# Patient Record
Sex: Female | Born: 1955
Health system: Southern US, Community
[De-identification: ages and names within clinical notes are randomized; demographics above are authoritative.]

## PROBLEM LIST (undated history)

## (undated) DIAGNOSIS — C801 Malignant (primary) neoplasm, unspecified: Secondary | ICD-10-CM

## (undated) DIAGNOSIS — E079 Disorder of thyroid, unspecified: Secondary | ICD-10-CM

## (undated) DIAGNOSIS — G4733 Obstructive sleep apnea (adult) (pediatric): Secondary | ICD-10-CM

## (undated) DIAGNOSIS — E669 Obesity, unspecified: Secondary | ICD-10-CM

## (undated) HISTORY — PX: BACK SURGERY: SHX140

## (undated) HISTORY — DX: Obesity, unspecified: E66.9

## (undated) HISTORY — PX: TOTAL THYROIDECTOMY: SHX2547

## (undated) HISTORY — DX: Obstructive sleep apnea (adult) (pediatric): G47.33

## (undated) HISTORY — DX: Malignant (primary) neoplasm, unspecified: C80.1

## (undated) HISTORY — DX: Disorder of thyroid, unspecified: E07.9

---

## 2004-01-21 ENCOUNTER — Other Ambulatory Visit: Admission: RE | Admit: 2004-01-21 | Discharge: 2004-01-21 | Payer: Self-pay | Admitting: Family Medicine

## 2004-08-27 ENCOUNTER — Inpatient Hospital Stay (HOSPITAL_COMMUNITY): Admission: RE | Admit: 2004-08-27 | Discharge: 2004-08-28 | Payer: Self-pay | Admitting: Neurosurgery

## 2004-10-12 ENCOUNTER — Encounter: Admission: RE | Admit: 2004-10-12 | Discharge: 2004-10-12 | Payer: Self-pay | Admitting: Neurosurgery

## 2004-11-11 ENCOUNTER — Ambulatory Visit (HOSPITAL_COMMUNITY): Admission: RE | Admit: 2004-11-11 | Discharge: 2004-11-11 | Payer: Self-pay | Admitting: Orthopedic Surgery

## 2004-11-11 ENCOUNTER — Encounter (INDEPENDENT_AMBULATORY_CARE_PROVIDER_SITE_OTHER): Payer: Self-pay | Admitting: Specialist

## 2004-11-11 ENCOUNTER — Ambulatory Visit (HOSPITAL_BASED_OUTPATIENT_CLINIC_OR_DEPARTMENT_OTHER): Admission: RE | Admit: 2004-11-11 | Discharge: 2004-11-11 | Payer: Self-pay | Admitting: Orthopedic Surgery

## 2004-11-29 ENCOUNTER — Encounter: Admission: RE | Admit: 2004-11-29 | Discharge: 2004-11-29 | Payer: Self-pay | Admitting: Neurosurgery

## 2005-07-17 ENCOUNTER — Emergency Department (HOSPITAL_COMMUNITY): Admission: EM | Admit: 2005-07-17 | Discharge: 2005-07-17 | Payer: Self-pay | Admitting: Emergency Medicine

## 2005-10-19 ENCOUNTER — Encounter (HOSPITAL_COMMUNITY): Admission: RE | Admit: 2005-10-19 | Discharge: 2006-01-17 | Payer: Self-pay | Admitting: Endocrinology

## 2005-11-14 ENCOUNTER — Other Ambulatory Visit: Admission: RE | Admit: 2005-11-14 | Discharge: 2005-11-14 | Payer: Self-pay | Admitting: Family Medicine

## 2006-02-16 ENCOUNTER — Encounter (INDEPENDENT_AMBULATORY_CARE_PROVIDER_SITE_OTHER): Payer: Self-pay | Admitting: Specialist

## 2006-02-16 ENCOUNTER — Ambulatory Visit (HOSPITAL_COMMUNITY): Admission: RE | Admit: 2006-02-16 | Discharge: 2006-02-17 | Payer: Self-pay | Admitting: Surgery

## 2006-04-03 ENCOUNTER — Encounter: Admission: RE | Admit: 2006-04-03 | Discharge: 2006-04-03 | Payer: Self-pay | Admitting: Endocrinology

## 2006-04-13 ENCOUNTER — Encounter: Admission: RE | Admit: 2006-04-13 | Discharge: 2006-04-13 | Payer: Self-pay | Admitting: Endocrinology

## 2006-04-24 ENCOUNTER — Encounter: Admission: RE | Admit: 2006-04-24 | Discharge: 2006-04-24 | Payer: Self-pay | Admitting: Family Medicine

## 2007-03-21 ENCOUNTER — Encounter: Admission: RE | Admit: 2007-03-21 | Discharge: 2007-03-21 | Payer: Self-pay | Admitting: Endocrinology

## 2007-10-05 ENCOUNTER — Ambulatory Visit (HOSPITAL_BASED_OUTPATIENT_CLINIC_OR_DEPARTMENT_OTHER): Admission: RE | Admit: 2007-10-05 | Discharge: 2007-10-05 | Payer: Self-pay | Admitting: Orthopedic Surgery

## 2007-10-11 ENCOUNTER — Ambulatory Visit (HOSPITAL_COMMUNITY): Admission: RE | Admit: 2007-10-11 | Discharge: 2007-10-11 | Payer: Self-pay | Admitting: Family Medicine

## 2007-12-15 ENCOUNTER — Encounter: Admission: RE | Admit: 2007-12-15 | Discharge: 2007-12-15 | Payer: Self-pay | Admitting: Neurosurgery

## 2007-12-25 ENCOUNTER — Ambulatory Visit (HOSPITAL_BASED_OUTPATIENT_CLINIC_OR_DEPARTMENT_OTHER): Admission: RE | Admit: 2007-12-25 | Discharge: 2007-12-25 | Payer: Self-pay | Admitting: Orthopedic Surgery

## 2010-02-28 ENCOUNTER — Encounter: Payer: Self-pay | Admitting: Endocrinology

## 2010-04-13 ENCOUNTER — Other Ambulatory Visit: Payer: Self-pay | Admitting: Family Medicine

## 2010-04-13 DIAGNOSIS — M5416 Radiculopathy, lumbar region: Secondary | ICD-10-CM

## 2010-04-15 ENCOUNTER — Ambulatory Visit
Admission: RE | Admit: 2010-04-15 | Discharge: 2010-04-15 | Disposition: A | Discharge status: Home / Self Care | Payer: Managed Care, Other (non HMO) | Source: Ambulatory Visit | Attending: Family Medicine | Admitting: Family Medicine

## 2010-04-15 DIAGNOSIS — M5416 Radiculopathy, lumbar region: Secondary | ICD-10-CM

## 2010-04-15 MED ORDER — GADOBENATE DIMEGLUMINE 529 MG/ML IV SOLN
19.0000 mL | Freq: Once | INTRAVENOUS | Status: AC | PRN
  Administered 2010-04-15: 19 mL via INTRAVENOUS

## 2010-06-22 NOTE — Op Note (Signed)
Yvonne Pierce, Yvonne Pierce                 ACCOUNT NO.:  [id number]   MEDICAL RECORD NO.:  [id number]          PATIENT TYPE:  AMB   LOCATION:  DSC                          FACILITY:  [facility]   PHYSICIAN:  Cindee Salt, M.D.       DATE OF BIRTH:  18-Jan-1956   DATE OF PROCEDURE:  12/25/2007  DATE OF DISCHARGE:                               OPERATIVE REPORT   PREOPERATIVE DIAGNOSIS:  Stenosing tenosynovitis, right thumb.   POSTOPERATIVE DIAGNOSIS:  Stenosing tenosynovitis, right thumb.   OPERATION:  Release A1 pulley, right thumb.   SURGEON:  Cindee Salt, MD   ASSISTANT:  Carolyne Fiscal, RN   ANESTHESIA:  Forearm-based IV regional.   ANESTHESIOLOGIST:  Kaylyn Layer. Michelle Piper, MD   HISTORY:  The patient is a 55 year old female with a history of  triggering of her right thumb.  This has not responded to conservative  treatment.  She has elected to undergo surgical decompression.  Pre,  peri, and postoperative course have been discussed along with the risks  and complications.  She is aware that there is no guarantee with the  surgery; possibility of infection; recurrence; injury to arteries,  nerves, and tendons; incomplete relief of symptoms; and dystrophy.  In  the preoperative area, the patient is seen, the extremity marked by both  the patient and surgeon, antibiotic given.   PROCEDURE:  The patient was brought to the operating room where a  forearm-based IV regional anesthetic was carried out without difficulty  under the direction of Dr. Michelle Piper with the right arm free.  A time-out  was taken.  She was prepped using DuraPrep in supine position.  A  transverse incision was made over the A1 pulley of the right thumb,  carried down through the subcutaneous tissue.  The A1 pulley was  identified.  Retractors placed.  After identification of the radial and  ulnar digital artery and nerve, an incision was then made on the radial  aspect of the A1 pulley.  The oblique pulley was left intact.  Area  compression  to the tendon was immediately apparent.  Thumb placed  through a full range motion.  No further triggering was noted.  The  wound was irrigated.  The skin closed with interrupted 5-0 Vicryl Rapide  sutures.  A sterile compressive dressing was applied.  The patient  tolerated the procedure well and was taken to the recovery room for  observation in satisfactory condition.  She will be discharged to home  to return to the [facility] in 1 week, on Vicodin.          ______________________________  Cindee Salt, M.D.    GK/MEDQ  D:  12/25/2007  T:  12/25/2007  Job:  [id number]   cc:   Molly Maduro L. Foy Guadalajara, M.D.

## 2010-06-22 NOTE — Op Note (Signed)
Yvonne Pierce, Yvonne Pierce                 ACCOUNT NO.:  [id number]   MEDICAL RECORD NO.:  [id number]          PATIENT TYPE:  AMB   LOCATION:  DSC                          FACILITY:  [facility]   PHYSICIAN:  Cindee Salt, M.D.       DATE OF BIRTH:  07/11/1955   DATE OF PROCEDURE:  10/05/2007  DATE OF DISCHARGE:                               OPERATIVE REPORT   PREOPERATIVE DIAGNOSIS:  Stenosing tenosynovitis, left thumb.   POSTOPERATIVE DIAGNOSIS:  Stenosing tenosynovitis, left thumb.   OPERATION:  Release of A1 pulley, left thumb.   SURGEON:  Cindee Salt, MD   ASSISTANT:  Joaquin Courts, RN   ANESTHESIA:  Forearm based IV regional.   ANESTHESIOLOGIST:  W. Autumn Patty, MD   HISTORY:  The patient is a 55 year old female with a history of  triggering of her left thumb.  This has not responded to conservative  treatment.  She is desirous of having this surgically released.  Pre,  peri, and postoperative course have been discussed along with risks and  complications.  She is aware that there is no guarantee with the  surgery; possibility of infection; recurrence of injury to arteries,  nerves, tendons, complete relief of symptoms; and dystrophy.  In the  preoperative area, the patient is seen.  The extremity marked by both  the patient and surgeon.  Antibiotic given.   PROCEDURE:  The patient was brought to the operating room where a  forearm based IV regional anesthetic was carried out without difficulty  under the direction of Dr. Sampson Goon.  She was prepped using DuraPrep,  supine position, left arm free.  After adequate anesthesia and time-out  was taken, a transverse incision was made over the A1 pulley of the left  thumb and carried down through the subcutaneous tissue.  Bleeders were  electrocauterized.  Retractors were placed protecting the neurovascular  structures.  A release to the A1 pulley was then performed on its ulnar  radial aspect.  The oblique pulley was left intact.   Significant  adherence of the tenosynovium tissue was present to the sheath and to  the A1 pulley.  This was released.  The thumb placed through full range  motion, no further triggering was noted.  The wound was irrigated, and  the skin closed with interrupted 5-0 Vicryl Rapide  sutures.  Sterile compressive dressing was applied.  The patient  tolerated the procedure well and was taken to the recovery room for  observation in satisfactory condition.  On deflation of the tourniquet,  all fingers immediately pinked.  She will be discharged home to return  to the hand center of [facility] in 1 week, on Vicodin.           ______________________________  Cindee Salt, M.D.     GK/MEDQ  D:  10/05/2007  T:  10/06/2007  Job:  [id number]   cc:   Molly Maduro L. Foy Guadalajara, M.D.

## 2010-06-25 NOTE — Op Note (Signed)
Yvonne Pierce, Yvonne Pierce                 ACCOUNT NO.:  [id number]   MEDICAL RECORD NO.:  [id number]          PATIENT TYPE:  INP   LOCATION:  2899                         FACILITY:  [facility]   PHYSICIAN:  Reinaldo Meeker, M.D. DATE OF BIRTH:  08-12-1955   DATE OF PROCEDURE:  08/27/2004  DATE OF DISCHARGE:                                 OPERATIVE REPORT   PREOPERATIVE DIAGNOSIS:  Herniated disc and degenerative disc disease L5-S1.   POSTOPERATIVE DIAGNOSIS:  Herniated disc and degenerative disc disease L5-  S1.   PROCEDURE:  Bilateral L5-S1 decompressive laminectomy followed by bilateral  microdiscectomy followed by posterior lumbar interbody fusion followed by  transfacet screw fixation and posterolateral fusion.   SECONDARY PROCEDURE:  Microdissection L5-S1 disc and S1 nerve roots  bilaterally, interoperative EMG monitoring.   SURGEON:  Reinaldo Meeker, M.D.   ASSISTANT:  Tia Alert, M.D.   PROCEDURE IN DETAIL:  After being placed in the prone position, the  patient's back was prepped and draped in the usual sterile fashion.  Localizing x-ray was taken prior to the incision to identify the appropriate  level.  A midline incision was made above the spinous processes of L5 and  S1.  Using Bovie cutting current, the incision was carried down to the  spinous processes.  Subperiosteal dissection was then carried out  bilaterally on the spinous processes, lamina, and facet joints.  Self-  retaining retractor was placed for exposure.  An x-ray showed we approached  the appropriate level.  The spinous processes and interspinous ligament were  removed.  Starting on the patient's left side, a laminotomy was performed by  removing the inferior 1/2 of the L5 lamina, medial 1/3 of the facet joint,  the superior 1/2 of the S1 lamina.  Residual bone and ligamentum flavum were  removed in a piecemeal fashion.  Bone was saved for use later in the case.  A similar procedure was then carried out  on the patient's right side, once  again removing the inferior 1/2 of the L5 lamina, medial 1/3 of the facet  joint, superior 1/2 of the S1 lamina.  Once again, residual bone was removed  and saved for use later in the case.  The ligamentum flavum was removed in a  piecemeal fashion.  The residual midline structures were then removed to  complete the bilateral decompressive laminectomy.  At this time,  microdiscectomy was carried out.  The disc space was markedly compressed and  after coagulating on the annulus and incising it, distractors were placed  sequentially until an 8 mm distractor had been placed.  The disc was then  thoroughly cleaned out including calcified central herniation.   At this time, the interspace was prepared for posterior lumbar interbody  fusion.  Starting on the patient's right side, scraping was carried out  followed by chiseling with an 8 by 9 mm chisel.  Nuvasive 8 by 9 by 20 bony  spacer was placed without difficulty and fluoroscopy showed it to be in good  position.  The distractor was removed from the opposite side and a  similar  procedure carried out.  Prior to placing the second bony spacer, autologous  bone graft and BMP were placed deep and in the midline.  A second bony  spacer was then placed in a good position which was confirmed by  fluoroscopy.  At this time, transfacet screw fixation was carried out.  Starting on the patient's right side, drill over a guide-wire was passed to  the inferior facet of L5 and into the pedicle of S1.  This was followed with  direct palpation, AP and lateral fluoroscopy, and also with interoperative  EMG monitoring which showed no evidence of breech of the pedicle.  Tapping  was then carried out followed by placing a 30 mm screw.  Fluoroscopy showed  it to be in good position.  A similar procedure was then carried out on the  patient's left side, once again passing drill over guide-wire to the  inferior facet of L5 and  into the pedicle of S1.  Once again, this was  followed with AP and lateral fluoroscopy, direct palpation of the medial  pedicle, and interoperative EMG monitoring which all, once again, showed no  evidence of breech of the pedicle.  Once again, tapping followed by  placement of 30 mm screw was carried out.   At this time, large amounts of irrigation was carried out.  Final  fluoroscopy in AP and lateral direction showed the screws and bone plug to  all be in good position.  At this time, a high speed drill was used to  mildly decorticate the facet joints bilaterally and BMP and autologous bone  graft were placed on there for posterolateral fusion.  At this time, Gelfoam  was placed over the dural exposure.  The wound was then closed in multiple  layers with Vicryl on the muscle, fascia, subcutaneous and subcuticular  tissues, and staples on the skin.  A sterile dressing was then applied and  the patient was extubated and taken to the recovery room in stable  condition.       ROK/MEDQ  D:  08/27/2004  T:  08/27/2004  Job:  [id number]

## 2010-06-25 NOTE — Op Note (Signed)
Yvonne Pierce, Yvonne Pierce                 ACCOUNT NO.:  [id number]   MEDICAL RECORD NO.:  [id number]          PATIENT TYPE:  AMB   LOCATION:  DSC                          FACILITY:  [facility]   PHYSICIAN:  Cindee Salt, M.D.       DATE OF BIRTH:  Dec 23, 1955   DATE OF PROCEDURE:  11/11/2004  DATE OF DISCHARGE:                                 OPERATIVE REPORT   PREOPERATIVE DIAGNOSIS:  Laceration digital nerve left ring finger ulnar  side.   POSTOPERATIVE DIAGNOSIS:  Laceration digital nerve left ring finger ulnar  side.   OPERATION:  Exploration repair with Neurolac wrap, left ring finger digital  nerve.   SURGEON:  Kuzma.   ASSISTANT:  Carolyne Fiscal, RN.   ANESTHESIA:  General.   HISTORY:  The patient is a 55 year old female who suffered a laceration to  the PIP area of her left ring finger. She has developed a prominent neuroma  with loss of sensation distally. She is admitted for exploration and repair.   PROCEDURE:  The patient is brought to the operating room where a general  anesthetic was carried out without difficulty. She was prepped using  DuraPrep, supine position, left arm free. A mid lateral incision was made,  carried down through subcutaneous tissue. The digital artery and nerve were  identified proximally and distally, followed through releasing Cleland  ligaments. This allowed visualization of the nerve. Grayson's ligaments were  also released.  The laceration to the nerve was immediately apparent.  The  dorsal sensory branch was intact as was the artery. The flexor tendon was  intact. The nerve was noted to be directly adherent and had grown directly  into the skin. The operative microscope was brought into position. The nerve  was then isolated, cut back to normal fascicles proximally and distally.  Due to the fact that the laceration was held in position by the dorsal  sensory branch of the nerve. A repair was able to be performed.  A conduit  was not necessary nor could be  placed due to the proximity of the dorsal  branch. However, a  Neurolac wrap was placed after repair was performed with  interrupted 9-0 nylon sutures with an epineural repair lining fascicles as  much as possible. This became adherent to itself forming a neural tube to  prevent significant adhesions at the joint level. There was no significant  tension on the repair. The wound was irrigated. The skin closed with  interrupted 5-0 nylon sutures.  A sterile compressive dressing and splint to  the finger applied. The patient tolerated the procedure well and was taken  to the recovery room for observation in satisfactory condition. She is  discharged home to return to the [facility] [place] in one week on  Vicodin.           ______________________________  Cindee Salt, M.D.     GK/MEDQ  D:  11/11/2004  T:  11/11/2004  Job:  [id number]

## 2010-06-25 NOTE — Op Note (Signed)
NAMESKYLYNNE, SCHLECHTER                 ACCOUNT NO.:  [id number]   MEDICAL RECORD NO.:  [id number]          PATIENT TYPE:  OIB   LOCATION:  0098                         FACILITY:  [facility]   PHYSICIAN:  Velora Heckler, MD      DATE OF BIRTH:  08-29-1955   DATE OF PROCEDURE:  02/16/2006  DATE OF DISCHARGE:                               OPERATIVE REPORT   PREOPERATIVE DIAGNOSIS:  Bilateral thyroid nodules with atypia,  hyperthyroidism.   POSTOPERATIVE DIAGNOSIS:  Bilateral thyroid nodules with atypia,  hyperthyroidism.   PROCEDURE:  Total thyroidectomy.   SURGEON:  Velora Heckler, MD, FACS   ASSISTANT:  Manus Rudd, MD, FACS   ANESTHESIA:  General.   ESTIMATED BLOOD LOSS:  Minimal.   PREPARATION:  Betadine.   COMPLICATIONS:  None.   INDICATIONS:  The patient is a 55 year old white female from [place],  [place].  The patient has known thyroid goiter with multiple  nodules and borderline hyperthyroidism.  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  TSH  level was suppressed at 0.34.  The patient now comes to surgery for  total thyroidectomy.   Procedure was done in OR #1 at [facility].  The  patient was brought to the operating room, placed in a supine position  on the operating room table.  Following administration of general  anesthesia, the patient is positioned and then prepped and draped in the  usual strict aseptic fashion.  After ascertaining that an adequate level  of anesthesia had been obtained, a Kocher incision was made with a #15  blade.  Dissection was carried through subcutaneous tissues and  platysma.  Skin flaps were developed cephalad and caudad.  A Mahorner  self-retaining retractor is placed for exposure.  Strap muscles are  incised in the midline.  Left thyroid lobe was exposed.  There are  multiple small nodules.  There was a particularly firm 1 cm nodule  located in the  posterior medial portion of the lobe.  Inferior venous  tributaries were divided with the harmonic scalpel.  Middle thyroid vein  is divided with the harmonic scalpel.  Gland is mobilized with blunt  dissection with a Barista.  Parathyroid tissue was identified  and preserved.  Superior pole vessels were ligated in continuity with 2-  0 silk ties and medium Ligaclips and divided.  Gland is rolled  anteriorly.  Recurrent nerve was identified and preserved along its  course.  Dissection is carried down to the ligament of Berry.  Branches  of the inferior thyroid artery are divided with the harmonic scalpel.  Ligament of Allyson Sabal is transected with the electrocautery, and the gland  is rolled up and onto the anterior trachea.  Small pyramidal lobe was  excised with the harmonic scalpel.   Next, we turned our attention to the right thyroid lobe.  Right lobe is  moderately larger than the left.  It contains multiple nodules, the  largest of which measures approximately 2 cm.  None of these were  particularly worrisome based on gross  appearance.  Venous tributaries  were divided between small Ligaclips.  Superior pole is dissected out  and transected using the harmonic scalpel.  Gland is rolled anteriorly.  Branches of the inferior thyroid artery are divided with the harmonic  scalpel.  Inferior venous tributaries were divided between Ligaclips.  Recurrent nerve was identified and preserved.  Superior parathyroid  gland was identified and preserved.  Dissection was carried down to the  ligament of Allyson Sabal which is transected with the electrocautery.  Gland is  excised off the anterior trachea with the harmonic scalpel.  A suture is  used to mark the right superior pole.  The entire specimen is submitted  to pathology for review.  Neck is irrigated with warm saline.  Surgicel  is placed in the tracheoesophageal groove bilaterally.  Strap muscles  are reapproximated in the midline with  interrupted 3-0 Vicryl sutures.  Platysma was closed with interrupted 3-0 Vicryl sutures.  Skin is closed  with a running 4-0 Vicryl subcuticular suture.  Wound is washed and  dried and Benzoin and Steri-Strips are applied.  Sterile dressings are  applied.  The patient is awakened from anesthesia and brought to the  recovery room in stable condition.  The patient tolerated the procedure  well.      Velora Heckler, MD  Electronically Signed     TMG/MEDQ  D:  02/16/2006  T:  02/16/2006  Job:  [id number]   cc:   Dorisann Frames, M.D.  Fax: [phone]   Doris Cheadle. Foy Guadalajara, M.D.  Fax: [phone]

## 2010-11-09 LAB — POCT HEMOGLOBIN-HEMACUE: Hemoglobin: 15.3 — ABNORMAL HIGH

## 2011-03-14 ENCOUNTER — Emergency Department (HOSPITAL_COMMUNITY): Payer: Managed Care, Other (non HMO)

## 2011-03-14 ENCOUNTER — Encounter (HOSPITAL_COMMUNITY): Payer: Self-pay | Admitting: Emergency Medicine

## 2011-03-14 ENCOUNTER — Inpatient Hospital Stay (HOSPITAL_COMMUNITY)
Admission: EM | Admit: 2011-03-14 | Discharge: 2011-03-16 | DRG: 343 | Disposition: A | Discharge status: Home / Self Care | Payer: Managed Care, Other (non HMO) | Attending: General Surgery | Admitting: General Surgery

## 2011-03-14 DIAGNOSIS — F17200 Nicotine dependence, unspecified, uncomplicated: Secondary | ICD-10-CM | POA: Diagnosis present

## 2011-03-14 DIAGNOSIS — K6389 Other specified diseases of intestine: Secondary | ICD-10-CM | POA: Diagnosis present

## 2011-03-14 DIAGNOSIS — K3580 Unspecified acute appendicitis: Principal | ICD-10-CM | POA: Diagnosis present

## 2011-03-14 DIAGNOSIS — R1031 Right lower quadrant pain: Secondary | ICD-10-CM | POA: Diagnosis present

## 2011-03-14 DIAGNOSIS — K37 Unspecified appendicitis: Secondary | ICD-10-CM

## 2011-03-14 LAB — URINALYSIS, ROUTINE W REFLEX MICROSCOPIC
Bilirubin Urine: NEGATIVE
Glucose, UA: NEGATIVE mg/dL
Ketones, ur: NEGATIVE mg/dL
Leukocytes, UA: NEGATIVE
Nitrite: NEGATIVE
Protein, ur: NEGATIVE mg/dL
Specific Gravity, Urine: 1.01 (ref 1.005–1.030)
Urobilinogen, UA: 0.2 mg/dL (ref 0.0–1.0)

## 2011-03-14 LAB — URINE MICROSCOPIC-ADD ON

## 2011-03-14 NOTE — ED Notes (Signed)
Pt states this afternoon she started to have abd pain and felt generally weak  Pt states the pain is on the right lower side and was seen at urgent care and was sent here for further evaluation  Pt states her WBC was elevated on the lab work they did

## 2011-03-14 NOTE — ED Provider Notes (Signed)
History     CSN: [id number]  Arrival date & time 03/14/11  1941   First MD Initiated Contact with Patient 03/14/11 2240      Chief Complaint  Patient presents with  . Abdominal Pain    (Consider location/radiation/quality/duration/timing/severity/associated sxs/prior treatment) HPI Patient is a 56 yo F who presents today complaining of RLQ pain that is a 6/10 now.  This began earlier today.  It is worse with palpation and movement.  She was seen at [facility] where she had a WBC of 16 and was referred here for further evaluation for appendicitis.  She denies urinary symptoms or vaginal discharge.  She denies any fevers or prior abdominal surgeries.  Patient has no radiation of her pain.  She last ate at 5 pm though she endorses anorexia.  There has not been any vomiting, constipation, or diarrhea though she does endorse nausea.  There are no other associated or modifying factors.  History reviewed. No pertinent past medical history.  Past Surgical History  Procedure Date  . Back surgery   . Total thyroidectomy     Family History  Problem Relation Age of Onset  . Diabetes Mother     History  Substance Use Topics  . Smoking status: Current Everyday Smoker -- 1.0 packs/day    Types: Cigarettes  . Smokeless tobacco: Not on file  . Alcohol Use: No    OB History    Grav Para Term Preterm Abortions TAB SAB Ect Mult Living                  Review of Systems  Constitutional: Positive for appetite change. Negative for fever.  HENT: Negative.   Eyes: Negative.   Respiratory: Negative.   Cardiovascular: Negative.   Gastrointestinal: Positive for nausea and abdominal pain.  Genitourinary: Negative.   Musculoskeletal: Negative.   Skin: Negative.   Neurological: Negative.   Hematological: Negative.   Psychiatric/Behavioral: Negative.   All other systems reviewed and are negative.    Allergies  Review of patient's allergies indicates no known allergies.  Home  Medications   Current Outpatient Rx  Name Route Sig Dispense Refill  . LEVOTHYROXINE SODIUM 175 MCG PO TABS Oral Take 175 mcg by mouth daily.      BP 178/75  Pulse 95  Temp(Src) 98.7 F (37.1 C) (Oral)  Resp 26  Ht 5\' 3"  (1.6 m)  Wt 200 lb (90.719 kg)  BMI 35.43 kg/m2  SpO2 97%  Physical Exam  Nursing note and vitals reviewed. GEN: Well-developed, well-nourished female in no distress HEENT: Atraumatic, normocephalic. Oropharynx clear without erythema EYES: PERRLA BL, no scleral icterus. NECK: Trachea midline, no meningismus CV: regular rate and rhythm. No murmurs, rubs, or gallops PULM: No respiratory distress.  No crackles, wheezes, or rales. GI: soft, mild TTP in RLQ. No guarding, rebound, or tenderness. + bowel sounds  Neuro: cranial nerves 2-12 intact, no abnormalities of strength or sensation, A and O x 3 MSK: Patient moves all 4 extremities symmetrically, no deformity, edema, or injury noted Psych: no abnormality of mood   ED Course  Procedures (including critical care time)  Labs Reviewed  CBC - Abnormal; Notable for the following:    WBC 16.8 (*)    All other components within normal limits  DIFFERENTIAL - Abnormal; Notable for the following:    Neutro Abs 12.4 (*)    Monocytes Absolute 1.2 (*)    All other components within normal limits  COMPREHENSIVE METABOLIC PANEL - Abnormal;  Notable for the following:    Calcium 8.0 (*)    Total Bilirubin 0.2 (*)    All other components within normal limits  URINALYSIS, ROUTINE W REFLEX MICROSCOPIC - Abnormal; Notable for the following:    Hgb urine dipstick SMALL (*)    All other components within normal limits  LIPASE, BLOOD  URINE MICROSCOPIC-ADD ON   Dg Chest 2 View  03/15/2011  *RADIOLOGY REPORT*  Clinical Data: Preoperative radiograph  CHEST - 2 VIEW  Comparison: 10/11/2007 CT  Findings: Lungs are clear. No pleural effusion or pneumothorax. The cardiomediastinal contours are within normal limits. The visualized  bones and soft tissues are without significant appreciable abnormality.  IMPRESSION: No acute cardiopulmonary process.  Original Report Authenticated By: Waneta Martins, M.D.   Ct Abdomen Pelvis W Contrast  03/15/2011  *RADIOLOGY REPORT*  Clinical Data: Right lower quadrant pain.  CT ABDOMEN AND PELVIS WITH CONTRAST  Technique:  Multidetector CT imaging of the abdomen and pelvis was performed following the standard protocol during bolus administration of intravenous contrast.  Contrast:  100 ml Omnipaque-300  Comparison: None.  Findings: Limited images through the lung bases demonstrate no significant appreciable abnormality. The heart size is within normal limits. No pleural or pericardial effusion.  Hepatic steatosis.  Unremarkable biliary system, spleen, pancreas, adrenal glands.  Symmetric renal enhancement.  No hydronephrosis or hydroureter.  No bowel obstruction.  No CT evidence for colitis.  Colonic diverticulosis.  The appendix is distended up to 1 cm with mild periappendiceal fat stranding.  No free intraperitoneal air or fluid.  No lymphadenopathy.  There is scattered atherosclerotic calcification of the aorta and its branches. No aneurysmal dilatation.  Thin-walled bladder.  Unremarkable appearance to the uterus and left adnexa.  8 mm peripherally calcified focus adjacent to the right ovary is nonspecific, may be vascular.  Surgical hardware at S1.  L5-S1 fusion. No acute osseous abnormality.  IMPRESSION: Acute appendicitis.  Hepatic steatosis.  Original Report Authenticated By: Waneta Martins, M.D.     1. Appendicitis       MDM  Patient was evaluated and had mild RLQ tenderness but did not have hemodynamic instability or an acute abdomen.  Patient did not want pain or nausea meds.  Labs were performed and patient had persistent WBC count elevation to 16.  She had CT demonstrating findings consistent with acute appendicitis.  Reassessment showed that she remained HDS and had no  significant changes in exam.  I spoke with Dr. Johna Sheriff of [facility] who saw the patient and agreed with diagnosis of acute appendicitis.  Patient was taken to the OR for further surgical management.        Cyndra Numbers, MD 03/15/11 [phone]

## 2011-03-15 ENCOUNTER — Encounter (HOSPITAL_COMMUNITY): Payer: Self-pay | Admitting: General Surgery

## 2011-03-15 ENCOUNTER — Encounter (HOSPITAL_COMMUNITY)
Admission: EM | Disposition: A | Discharge status: Home / Self Care | Payer: Self-pay | Source: Home / Self Care | Attending: General Surgery

## 2011-03-15 ENCOUNTER — Emergency Department (HOSPITAL_COMMUNITY): Payer: Managed Care, Other (non HMO) | Admitting: Anesthesiology

## 2011-03-15 ENCOUNTER — Other Ambulatory Visit (INDEPENDENT_AMBULATORY_CARE_PROVIDER_SITE_OTHER): Payer: Self-pay | Admitting: General Surgery

## 2011-03-15 ENCOUNTER — Emergency Department (HOSPITAL_COMMUNITY): Payer: Managed Care, Other (non HMO)

## 2011-03-15 ENCOUNTER — Encounter (HOSPITAL_COMMUNITY): Payer: Self-pay | Admitting: Anesthesiology

## 2011-03-15 ENCOUNTER — Other Ambulatory Visit: Payer: Self-pay

## 2011-03-15 DIAGNOSIS — K3580 Unspecified acute appendicitis: Secondary | ICD-10-CM | POA: Diagnosis present

## 2011-03-15 HISTORY — PX: LAPAROSCOPIC APPENDECTOMY: SHX408

## 2011-03-15 LAB — CBC
HCT: 42.7 % (ref 36.0–46.0)
Hemoglobin: 14.2 g/dL (ref 12.0–15.0)
MCH: 29.1 pg (ref 26.0–34.0)
MCHC: 33.3 g/dL (ref 30.0–36.0)
MCV: 87.5 fL (ref 78.0–100.0)
RBC: 4.88 MIL/uL (ref 3.87–5.11)
RDW: 13.8 % (ref 11.5–15.5)
WBC: 16.8 10*3/uL — ABNORMAL HIGH (ref 4.0–10.5)

## 2011-03-15 LAB — DIFFERENTIAL
Basophils Absolute: 0 10*3/uL (ref 0.0–0.1)
Basophils Relative: 0 % (ref 0–1)
Eosinophils Absolute: 0.1 10*3/uL (ref 0.0–0.7)
Eosinophils Relative: 0 % (ref 0–5)
Lymphocytes Relative: 18 % (ref 12–46)
Lymphs Abs: 3.1 10*3/uL (ref 0.7–4.0)
Monocytes Relative: 7 % (ref 3–12)
Neutro Abs: 12.4 10*3/uL — ABNORMAL HIGH (ref 1.7–7.7)

## 2011-03-15 LAB — COMPREHENSIVE METABOLIC PANEL
ALT: 29 U/L (ref 0–35)
AST: 24 U/L (ref 0–37)
Albumin: 4 g/dL (ref 3.5–5.2)
Alkaline Phosphatase: 74 U/L (ref 39–117)
BUN: 8 mg/dL (ref 6–23)
CO2: 24 mEq/L (ref 19–32)
Calcium: 8 mg/dL — ABNORMAL LOW (ref 8.4–10.5)
Chloride: 102 mEq/L (ref 96–112)
Creatinine, Ser: 0.56 mg/dL (ref 0.50–1.10)
GFR calc Af Amer: >90 mL/min (ref >90)
GFR calc non Af Amer: >90 mL/min (ref >90)
Glucose, Bld: 89 mg/dL (ref 70–99)
Potassium: 4 mEq/L (ref 3.5–5.1)
Sodium: 140 mEq/L (ref 135–145)
Total Bilirubin: 0.2 mg/dL — ABNORMAL LOW (ref 0.3–1.2)
Total Protein: 7.7 g/dL (ref 6.0–8.3)

## 2011-03-15 LAB — LIPASE, BLOOD: Lipase: 32 U/L (ref 11–59)

## 2011-03-15 SURGERY — APPENDECTOMY, LAPAROSCOPIC
Anesthesia: General | Site: Abdomen | Wound class: Contaminated

## 2011-03-15 MED ORDER — DEXAMETHASONE SODIUM PHOSPHATE 10 MG/ML IJ SOLN
INTRAMUSCULAR | Status: DC | PRN
  Administered 2011-03-15: 10 mg via INTRAVENOUS

## 2011-03-15 MED ORDER — MORPHINE SULFATE 2 MG/ML IJ SOLN
2.0000 mg | INTRAMUSCULAR | Status: DC | PRN
Start: 2011-03-15 — End: 2011-03-16

## 2011-03-15 MED ORDER — FENTANYL CITRATE 0.05 MG/ML IJ SOLN
INTRAMUSCULAR | Status: DC | PRN
  Administered 2011-03-15 (×2): 100 ug via INTRAVENOUS
  Administered 2011-03-15: 50 ug via INTRAVENOUS

## 2011-03-15 MED ORDER — POTASSIUM CHLORIDE IN NACL 20-0.9 MEQ/L-% IV SOLN
INTRAVENOUS | Status: DC
  Administered 2011-03-15: 75 mL/h via INTRAVENOUS
  Administered 2011-03-15: 22:00:00 via INTRAVENOUS
  Filled 2011-03-15 (×4): qty 1000

## 2011-03-15 MED ORDER — PROMETHAZINE HCL 25 MG/ML IJ SOLN: 6.2500 mg | INTRAMUSCULAR | Status: DC | PRN

## 2011-03-15 MED ORDER — HEPARIN SODIUM (PORCINE) 5000 UNIT/ML IJ SOLN
5000.0000 [IU] | Freq: Three times a day (TID) | INTRAMUSCULAR | Status: DC
  Administered 2011-03-15 – 2011-03-16 (×2): 5000 [IU] via SUBCUTANEOUS
  Filled 2011-03-15 (×6): qty 1

## 2011-03-15 MED ORDER — POTASSIUM CHLORIDE IN NACL 20-0.9 MEQ/L-% IV SOLN
INTRAVENOUS | Status: AC
  Filled 2011-03-15: qty 1000

## 2011-03-15 MED ORDER — BUPIVACAINE HCL (PF) 0.5 % IJ SOLN
INTRAMUSCULAR | Status: AC
  Filled 2011-03-15: qty 30

## 2011-03-15 MED ORDER — LEVOTHYROXINE SODIUM 175 MCG PO TABS
175.0000 ug | ORAL_TABLET | Freq: Every day | ORAL | Status: DC
  Administered 2011-03-15 – 2011-03-16 (×2): 175 ug via ORAL
  Filled 2011-03-15 (×2): qty 1

## 2011-03-15 MED ORDER — ONDANSETRON HCL 4 MG/2ML IJ SOLN: 4.0000 mg | Freq: Four times a day (QID) | INTRAMUSCULAR | Status: DC | PRN

## 2011-03-15 MED ORDER — GLYCOPYRROLATE 0.2 MG/ML IJ SOLN
INTRAMUSCULAR | Status: DC | PRN
  Administered 2011-03-15: .6 mg via INTRAVENOUS

## 2011-03-15 MED ORDER — LACTATED RINGERS IR SOLN
Status: DC | PRN
  Administered 2011-03-15: 1000 mL

## 2011-03-15 MED ORDER — BUPIVACAINE-EPINEPHRINE 0.5% -1:200000 IJ SOLN
INTRAMUSCULAR | Status: DC | PRN
  Administered 2011-03-15: 27 mL

## 2011-03-15 MED ORDER — SUCCINYLCHOLINE CHLORIDE 20 MG/ML IJ SOLN
INTRAMUSCULAR | Status: DC | PRN
  Administered 2011-03-15: 100 mg via INTRAVENOUS

## 2011-03-15 MED ORDER — ROCURONIUM BROMIDE 100 MG/10ML IV SOLN
INTRAVENOUS | Status: DC | PRN
  Administered 2011-03-15: 30 mg via INTRAVENOUS

## 2011-03-15 MED ORDER — PHENYLEPHRINE HCL 10 MG/ML IJ SOLN
INTRAMUSCULAR | Status: DC | PRN
  Administered 2011-03-15: 40 ug via INTRAVENOUS

## 2011-03-15 MED ORDER — KETOROLAC TROMETHAMINE 30 MG/ML IJ SOLN: 15.0000 mg | Freq: Once | INTRAMUSCULAR | Status: DC | PRN

## 2011-03-15 MED ORDER — PROPOFOL 10 MG/ML IV BOLUS
INTRAVENOUS | Status: DC | PRN
  Administered 2011-03-15: 180 mg via INTRAVENOUS

## 2011-03-15 MED ORDER — ERTAPENEM SODIUM 1 G IJ SOLR
1.0000 g | INTRAMUSCULAR | Status: AC
  Administered 2011-03-15: 1 g via INTRAVENOUS
  Filled 2011-03-15 (×2): qty 1

## 2011-03-15 MED ORDER — LIDOCAINE HCL (CARDIAC) 20 MG/ML IV SOLN
INTRAVENOUS | Status: DC | PRN
  Administered 2011-03-15: 50 mg via INTRAVENOUS

## 2011-03-15 MED ORDER — MIDAZOLAM HCL 5 MG/5ML IJ SOLN
INTRAMUSCULAR | Status: DC | PRN
  Administered 2011-03-15: 2 mg via INTRAVENOUS

## 2011-03-15 MED ORDER — LACTATED RINGERS IV SOLN
INTRAVENOUS | Status: DC | PRN
  Administered 2011-03-15 (×2): via INTRAVENOUS

## 2011-03-15 MED ORDER — NEOSTIGMINE METHYLSULFATE 1 MG/ML IJ SOLN
INTRAMUSCULAR | Status: DC | PRN
  Administered 2011-03-15: 5 mg via INTRAVENOUS

## 2011-03-15 MED ORDER — FENTANYL CITRATE 0.05 MG/ML IJ SOLN: 25.0000 ug | INTRAMUSCULAR | Status: DC | PRN

## 2011-03-15 MED ORDER — IOHEXOL 300 MG/ML  SOLN: 100.0000 mL | Freq: Once | INTRAMUSCULAR | Status: DC | PRN

## 2011-03-15 MED ORDER — OXYCODONE-ACETAMINOPHEN 5-325 MG PO TABS: 1.0000 | ORAL_TABLET | ORAL | Status: DC | PRN

## 2011-03-15 MED ORDER — ONDANSETRON HCL 4 MG/2ML IJ SOLN
INTRAMUSCULAR | Status: DC | PRN
  Administered 2011-03-15: 4 mg via INTRAVENOUS

## 2011-03-15 MED ORDER — ONDANSETRON HCL 4 MG PO TABS: 4.0000 mg | ORAL_TABLET | Freq: Four times a day (QID) | ORAL | Status: DC | PRN

## 2011-03-15 SURGICAL SUPPLY — 41 items
APPLIER CLIP 5 13 M/L LIGAMAX5 (MISCELLANEOUS)
APPLIER CLIP ROT 10 11.4 M/L (STAPLE)
CANISTER SUCTION 2500CC (MISCELLANEOUS) ×2 IMPLANT
CHLORAPREP W/TINT 26ML (MISCELLANEOUS) ×2 IMPLANT
CLIP APPLIE 5 13 M/L LIGAMAX5 (MISCELLANEOUS) IMPLANT
CLIP APPLIE ROT 10 11.4 M/L (STAPLE) IMPLANT
CLOTH BEACON ORANGE TIMEOUT ST (SAFETY) ×2 IMPLANT
CUTTER FLEX LINEAR 45M (STAPLE) ×2 IMPLANT
DECANTER SPIKE VIAL GLASS SM (MISCELLANEOUS) IMPLANT
DERMABOND ADVANCED (GAUZE/BANDAGES/DRESSINGS) ×1
DERMABOND ADVANCED .7 DNX12 (GAUZE/BANDAGES/DRESSINGS) ×1 IMPLANT
DRAPE LAPAROSCOPIC ABDOMINAL (DRAPES) ×2 IMPLANT
DRAPE UTILITY 15X26 (DRAPE) ×2 IMPLANT
ELECT REM PT RETURN 9FT ADLT (ELECTROSURGICAL) ×2
ELECTRODE REM PT RTRN 9FT ADLT (ELECTROSURGICAL) ×1 IMPLANT
GLOVE BIOGEL PI IND STRL 7.0 (GLOVE) ×1 IMPLANT
GLOVE BIOGEL PI INDICATOR 7.0 (GLOVE) ×1
GLOVE SS BIOGEL STRL SZ 7.5 (GLOVE) ×1 IMPLANT
GLOVE SUPERSENSE BIOGEL SZ 7.5 (GLOVE) ×1
GOWN STRL NON-REIN LRG LVL3 (GOWN DISPOSABLE) ×2 IMPLANT
GOWN STRL REIN XL XLG (GOWN DISPOSABLE) ×4 IMPLANT
IV LACTATED RINGERS 1000ML (IV SOLUTION) ×2 IMPLANT
KIT BASIN OR (CUSTOM PROCEDURE TRAY) ×2 IMPLANT
NS IRRIG 1000ML POUR BTL (IV SOLUTION) IMPLANT
PENCIL BUTTON HOLSTER BLD 10FT (ELECTRODE) IMPLANT
POUCH SPECIMEN RETRIEVAL 10MM (ENDOMECHANICALS) ×4 IMPLANT
RELOAD 45 VASCULAR/THIN (ENDOMECHANICALS) ×2 IMPLANT
RELOAD STAPLE TA45 3.5 REG BLU (ENDOMECHANICALS) IMPLANT
SCALPEL HARMONIC ACE (MISCELLANEOUS) ×2 IMPLANT
SET IRRIG TUBING LAPAROSCOPIC (IRRIGATION / IRRIGATOR) ×2 IMPLANT
SLEEVE Z-THREAD 5X100MM (TROCAR) IMPLANT
SOLUTION ANTI FOG 6CC (MISCELLANEOUS) ×2 IMPLANT
STRIP CLOSURE SKIN 1/2X4 (GAUZE/BANDAGES/DRESSINGS) ×2 IMPLANT
SUT MNCRL AB 4-0 PS2 18 (SUTURE) ×2 IMPLANT
TOWEL OR 17X26 10 PK STRL BLUE (TOWEL DISPOSABLE) ×2 IMPLANT
TRAY FOLEY CATH 14FRSI W/METER (CATHETERS) ×2 IMPLANT
TRAY LAP CHOLE (CUSTOM PROCEDURE TRAY) ×2 IMPLANT
TROCAR XCEL BLUNT TIP 100MML (ENDOMECHANICALS) ×2 IMPLANT
TROCAR Z-THREAD FIOS 12X100MM (TROCAR) IMPLANT
TROCAR Z-THREAD FIOS 5X100MM (TROCAR) ×2 IMPLANT
TUBING INSUFFLATION 10FT LAP (TUBING) ×2 IMPLANT

## 2011-03-15 NOTE — Anesthesia Procedure Notes (Signed)
Procedure Name: Intubation Date/Time: 03/15/2011 5:14 AM Performed by: Joycie Peek Pre-anesthesia Checklist: Patient identified, Timeout performed, Emergency Drugs available, Suction available and Patient being monitored Patient Re-evaluated:Patient Re-evaluated prior to inductionOxygen Delivery Method: Circle System Utilized Preoxygenation: Pre-oxygenation with 100% oxygen Intubation Type: IV induction, Rapid sequence and Cricoid Pressure applied Laryngoscope Size: Mac and 3 Grade View: Grade III Tube size: 7.0 mm Number of attempts: 1 Airway Equipment and Method: bougie stylet Placement Confirmation: ETT inserted through vocal cords under direct vision,  breath sounds checked- equal and bilateral and positive ETCO2 Secured at: 21 cm Tube secured with: Tape Dental Injury: Teeth and Oropharynx as per pre-operative assessment  Difficulty Due To: Difficulty was unanticipated and Difficult Airway- due to anterior larynx Future Recommendations: Recommend- induction with short-acting agent, and alternative techniques readily available

## 2011-03-15 NOTE — Anesthesia Postprocedure Evaluation (Signed)
  Anesthesia Post-op Note  Patient: Yvonne Pierce  Procedure(s) Performed:  APPENDECTOMY LAPAROSCOPIC - Excision of infarcted torsion appendix epiploicae  Patient Location: PACU  Anesthesia Type: General  Level of Consciousness: oriented and sedated  Airway and Oxygen Therapy: Patient Spontanous Breathing and Patient connected to nasal cannula oxygen  Post-op Pain: mild  Post-op Assessment: Post-op Vital signs reviewed, Patient's Cardiovascular Status Stable, Respiratory Function Stable and Patent Airway  Post-op Vital Signs: stable  Complications: No apparent anesthesia complications

## 2011-03-15 NOTE — Progress Notes (Signed)
Pt has been educated regarding correct use of Incentive Spirometer (IS). Pt verbalizes understanding of correct use. Pt states that there is no further questions regarding correct use of the IS. Quandra Fedorchak Lorraine Giulliana Mcroberts, RN    

## 2011-03-15 NOTE — H&P (Signed)
Yvonne Pierce is an 56 y.o. female.   Chief Complaint: abdominal pain HPI: patient is a 56 year old female who approximately 18 hours ago developed the gradual onset of right lower quadrant abdominal pain. She felt somewhat weak and tremulous at that time. The pain gradually increased and has been constant ever since. It is worse with motion and relieved with rest. She has had some nausea but no vomiting. Normal bowel movement without diarrhea or constipation or blood. No urinary symptoms. She was seen at [facility] urgent clinic and found to have an elevated white count and was referred to the emergency room for further evaluation. She denies any history of similar previous problems or chronic abdominal or GI complaints.  History reviewed. No pertinent past medical history.  Past Surgical History  Procedure Date  . Back surgery   . Total thyroidectomy   . Cesarean section     Family History  Problem Relation Age of Onset  . Diabetes Mother    Social History:  reports that she has been smoking Cigarettes.  She has been smoking about 1 pack per day. She does not have any smokeless tobacco history on file. She reports that she does not drink alcohol or use illicit drugs.  Allergies: No Known Allergies  Medications Prior to Admission                       Current Outpatient Prescriptions  Medication Sig Dispense Refill  . levothyroxine (SYNTHROID, LEVOTHROID) 175 MCG tablet Take 175 mcg by mouth daily.          Results for orders placed during the hospital encounter of 03/14/11 (from the past 48 hour(s))  URINALYSIS, ROUTINE W REFLEX MICROSCOPIC     Status: Abnormal   Collection Time   03/14/11 10:52 PM      Component Value Range Comment   Color, Urine YELLOW  YELLOW     APPearance CLEAR  CLEAR     Specific Gravity, Urine 1.010  1.005 - 1.030     pH 6.5  5.0 - 8.0     Glucose, UA NEGATIVE  NEGATIVE (mg/dL)    Hgb urine dipstick SMALL (*) NEGATIVE     Bilirubin Urine NEGATIVE   NEGATIVE     Ketones, ur NEGATIVE  NEGATIVE (mg/dL)    Protein, ur NEGATIVE  NEGATIVE (mg/dL)    Urobilinogen, UA 0.2  0.0 - 1.0 (mg/dL)    Nitrite NEGATIVE  NEGATIVE     Leukocytes, UA NEGATIVE  NEGATIVE    URINE MICROSCOPIC-ADD ON     Status: Normal   Collection Time   03/14/11 10:52 PM      Component Value Range Comment   Squamous Epithelial / LPF RARE  RARE     RBC / HPF 0-2  <3 (RBC/hpf)    Bacteria, UA RARE  RARE    CBC     Status: Abnormal   Collection Time   03/14/11 11:30 PM      Component Value Range Comment   WBC 16.8 (*) 4.0 - 10.5 (K/uL)    RBC 4.88  3.87 - 5.11 (MIL/uL)    Hemoglobin 14.2  12.0 - 15.0 (g/dL)    HCT 29.5  62.1 - 30.8 (%)    MCV 87.5  78.0 - 100.0 (fL)    MCH 29.1  26.0 - 34.0 (pg)    MCHC 33.3  30.0 - 36.0 (g/dL)    RDW 65.7  84.6 - 96.2 (%)  Platelets 310  150 - 400 (K/uL)   DIFFERENTIAL     Status: Abnormal   Collection Time   03/14/11 11:30 PM      Component Value Range Comment   Neutrophils Relative 74  43 - 77 (%)    Neutro Abs 12.4 (*) 1.7 - 7.7 (K/uL)    Lymphocytes Relative 18  12 - 46 (%)    Lymphs Abs 3.1  0.7 - 4.0 (K/uL)    Monocytes Relative 7  3 - 12 (%)    Monocytes Absolute 1.2 (*) 0.1 - 1.0 (K/uL)    Eosinophils Relative 0  0 - 5 (%)    Eosinophils Absolute 0.1  0.0 - 0.7 (K/uL)    Basophils Relative 0  0 - 1 (%)    Basophils Absolute 0.0  0.0 - 0.1 (K/uL)   COMPREHENSIVE METABOLIC PANEL     Status: Abnormal   Collection Time   03/14/11 11:30 PM      Component Value Range Comment   Sodium 140  135 - 145 (mEq/L)    Potassium 4.0  3.5 - 5.1 (mEq/L)    Chloride 102  96 - 112 (mEq/L)    CO2 24  19 - 32 (mEq/L)    Glucose, Bld 89  70 - 99 (mg/dL)    BUN 8  6 - 23 (mg/dL)    Creatinine, Ser 1.61  0.50 - 1.10 (mg/dL)    Calcium 8.0 (*) 8.4 - 10.5 (mg/dL)    Total Protein 7.7  6.0 - 8.3 (g/dL)    Albumin 4.0  3.5 - 5.2 (g/dL)    AST 24  0 - 37 (U/L) SLIGHT HEMOLYSIS   ALT 29  0 - 35 (U/L)    Alkaline Phosphatase 74  39 - 117  (U/L)    Total Bilirubin 0.2 (*) 0.3 - 1.2 (mg/dL)    GFR calc non Af Amer >90  >90 (mL/min)    GFR calc Af Amer >90  >90 (mL/min)   LIPASE, BLOOD     Status: Normal   Collection Time   03/14/11 11:30 PM      Component Value Range Comment   Lipase 32  11 - 59 (U/L)    Ct Abdomen Pelvis W Contrast  03/15/2011  *RADIOLOGY REPORT*  Clinical Data: Right lower quadrant pain.  CT ABDOMEN AND PELVIS WITH CONTRAST  Technique:  Multidetector CT imaging of the abdomen and pelvis was performed following the standard protocol during bolus administration of intravenous contrast.  Contrast:  100 ml Omnipaque-300  Comparison: None.  Findings: Limited images through the lung bases demonstrate no significant appreciable abnormality. The heart size is within normal limits. No pleural or pericardial effusion.  Hepatic steatosis.  Unremarkable biliary system, spleen, pancreas, adrenal glands.  Symmetric renal enhancement.  No hydronephrosis or hydroureter.  No bowel obstruction.  No CT evidence for colitis.  Colonic diverticulosis.  The appendix is distended up to 1 cm with mild periappendiceal fat stranding.  No free intraperitoneal air or fluid.  No lymphadenopathy.  There is scattered atherosclerotic calcification of the aorta and its branches. No aneurysmal dilatation.  Thin-walled bladder.  Unremarkable appearance to the uterus and left adnexa.  8 mm peripherally calcified focus adjacent to the right ovary is nonspecific, may be vascular.  Surgical hardware at S1.  L5-S1 fusion. No acute osseous abnormality.  IMPRESSION: Acute appendicitis.  Hepatic steatosis.  Original Report Authenticated By: Waneta Martins, M.D.    Review of Systems  Constitutional: Positive for  malaise/fatigue. Negative for fever and chills.  Respiratory: Negative for cough and shortness of breath.   Cardiovascular: Negative for chest pain, palpitations and leg swelling.  Gastrointestinal: Positive for nausea and abdominal pain. Negative for  vomiting, diarrhea, constipation, blood in stool and melena.  Genitourinary: Negative for dysuria, urgency and frequency.  Musculoskeletal: Positive for back pain and joint pain.  Neurological: Positive for weakness.    Blood pressure 129/73, pulse 89, temperature 98.5 F (36.9 C), temperature source Oral, resp. rate 16, height 5\' 3"  (1.6 m), weight 200 lb (90.719 kg), SpO2 97.00%. Physical Exam  General: Alert, moderately obese Caucasian female, in no distress Skin: Warm and dry without rash or infection. HEENT: No palpable masses. Well-healed thyroidectomy scar.Sclera nonicteric. Pupils equal round and reactive. Oropharynx clear. Lymph nodes: No cervical, supraclavicular, or inguinal nodes palpable. Lungs: Breath sounds clear and equal without increased work of breathing Cardiovascular: Regular rate and rhythm without murmur. No JVD or edema. Peripheral pulses intact. Abdomen: Nondistended. Well-healed low midline incision without tenderness. Moderately obese. There is localized mild to moderate right lower quadrant tenderness without guarding or peritoneal signs.. No masses palpable. No organomegaly. No palpable hernias. Extremities: No edema or joint swelling or deformity. No chronic venous stasis changes. Neurologic: Alert and fully oriented.  Assessment/Plan Persistent right lower quadrant pain and nausea with elevated white count and CT scan confirming acute appendicitis.  The patient is receiving broad spectrum IV antibiotics and will be taken to the operating room for emergency laparoscopic appendectomy. I discussed the nature and indications for the procedure and risks of anesthetic complications, bleeding, infection, possible need for open surgery. She understands and agrees to proceed.  Shamar Kracke T 03/15/2011, 3:56 AM

## 2011-03-15 NOTE — Progress Notes (Signed)
Day of Surgery  Subjective:   Objective: Vital signs in last 24 hours: Temp:  [97.5 F (36.4 C)-98.9 F (37.2 C)] 98.5 F (36.9 C) (02/05 0817) Pulse Rate:  [69-95] 89  (02/05 0817) Resp:  [14-26] 18  (02/05 0817) BP: (109-178)/(56-83) 132/83 mmHg (02/05 0817) SpO2:  [96 %-100 %] 96 % (02/05 0817) Weight:  [90.719 kg (200 lb)] 90.719 kg (200 lb) (02/04 2034)    Intake/Output from previous day: 02/04 0701 - 02/05 0700 In: 1500 [I.V.:1500] Out: 345 [Urine:325; Blood:20] Intake/Output this shift: Total I/O In: 200 [I.V.:200] Out: 700 [Urine:700]  PE:  Alert, no pain so far looks good.  Abd. Tender, not distended.  Dressing dry.  Lab Results:   [facility] 03/14/11 2330  WBC 16.8*  HGB 14.2  HCT 42.7  PLT 310    Lab 03/14/11 2330  AST 24  ALT 29  ALKPHOS 74  BILITOT 0.2*  PROT 7.7  ALBUMIN 4.0    BMET  Basename 03/14/11 2330  NA 140  K 4.0  CL 102  CO2 24  GLUCOSE 89  BUN 8  CREATININE 0.56  CALCIUM 8.0*   PT/INR No results found for this basename: LABPROT:2,INR:2 in the last 72 hours   Studies/Results: Dg Chest 2 View  03/15/2011  *RADIOLOGY REPORT*  Clinical Data: Preoperative radiograph  CHEST - 2 VIEW  Comparison: 10/11/2007 CT  Findings: Lungs are clear. No pleural effusion or pneumothorax. The cardiomediastinal contours are within normal limits. The visualized bones and soft tissues are without significant appreciable abnormality.  IMPRESSION: No acute cardiopulmonary process.  Original Report Authenticated By: Waneta Martins, M.D.   Ct Abdomen Pelvis W Contrast  03/15/2011  *RADIOLOGY REPORT*  Clinical Data: Right lower quadrant pain.  CT ABDOMEN AND PELVIS WITH CONTRAST  Technique:  Multidetector CT imaging of the abdomen and pelvis was performed following the standard protocol during bolus administration of intravenous contrast.  Contrast:  100 ml Omnipaque-300  Comparison: None.  Findings: Limited images through the lung bases demonstrate no  significant appreciable abnormality. The heart size is within normal limits. No pleural or pericardial effusion.  Hepatic steatosis.  Unremarkable biliary system, spleen, pancreas, adrenal glands.  Symmetric renal enhancement.  No hydronephrosis or hydroureter.  No bowel obstruction.  No CT evidence for colitis.  Colonic diverticulosis.  The appendix is distended up to 1 cm with mild periappendiceal fat stranding.  No free intraperitoneal air or fluid.  No lymphadenopathy.  There is scattered atherosclerotic calcification of the aorta and its branches. No aneurysmal dilatation.  Thin-walled bladder.  Unremarkable appearance to the uterus and left adnexa.  8 mm peripherally calcified focus adjacent to the right ovary is nonspecific, may be vascular.  Surgical hardware at S1.  L5-S1 fusion. No acute osseous abnormality.  IMPRESSION: Acute appendicitis.  Hepatic steatosis.  Original Report Authenticated By: Waneta Martins, M.D.    Anti-infectives: Anti-infectives     Start     Dose/Rate Route Frequency Ordered Stop   03/15/11 0407   ertapenem (INVANZ) 1 g in sodium chloride 0.9 % 50 mL IVPB        1 g 100 mL/hr over 30 Minutes Intravenous 60 min pre-op 03/15/11 0407 03/15/11 0510         Current Facility-Administered Medications  Medication Dose Route Frequency Provider Last Rate Last Dose  . 0.9 % NaCl with KCl 20 mEq / L 20-0.9 MEQ/L-% infusion           . 0.9 % NaCl with  KCl 20 mEq/ L  infusion   Intravenous Continuous Mariella Saa, MD 75 mL/hr at 03/15/11 0832 75 mL/hr at 03/15/11 0832  . ertapenem (INVANZ) 1 g in sodium chloride 0.9 % 50 mL IVPB  1 g Intravenous 60 min Pre-Op Mariella Saa, MD   1 g at 03/15/11 0440  . heparin injection 5,000 Units  5,000 Units Subcutaneous Q8H Mariella Saa, MD      . levothyroxine (SYNTHROID, LEVOTHROID) tablet 175 mcg  175 mcg Oral Daily Mariella Saa, MD      . morphine 2 MG/ML injection 2-4 mg  2-4 mg Intravenous Q2H PRN  Mariella Saa, MD      . ondansetron [facility]) tablet 4 mg  4 mg Oral Q6H PRN Mariella Saa, MD       Or  . ondansetron (ZOFRAN) injection 4 mg  4 mg Intravenous Q6H PRN Mariella Saa, MD      . oxyCODONE-acetaminophen (PERCOCET) 5-325 MG per tablet 1-2 tablet  1-2 tablet Oral Q4H PRN Mariella Saa, MD      . DISCONTD: bupivacaine-EPINEPHrine (MARCAINE W/ EPI) 0.5 % (with pres) injection    PRN Mariella Saa, MD   27 mL at 03/15/11 0555  . DISCONTD: fentaNYL (SUBLIMAZE) injection 25-50 mcg  25-50 mcg Intravenous Q5 min PRN Riesa Pope, MD      . DISCONTD: iohexol (OMNIPAQUE) 300 MG/ML solution 100 mL  100 mL Intravenous Once PRN Medication Radiologist, MD      . DISCONTD: ketorolac (TORADOL) 30 MG/ML injection 15-30 mg  15-30 mg Intravenous Once PRN Riesa Pope, MD      . DISCONTD: lactated ringers irrigation solution    PRN Mariella Saa, MD   1,000 mL at 03/15/11 0554  . DISCONTD: promethazine (PHENERGAN) injection 6.25-12.5 mg  6.25-12.5 mg Intravenous Q15 min PRN Riesa Pope, MD       Facility-Administered Medications Ordered in Other Encounters  Medication Dose Route Frequency Provider Last Rate Last Dose  . DISCONTD: dexamethasone (DECADRON) injection    PRN Joycie Peek, CRNA   10 mg at 03/15/11 0527  . DISCONTD: fentaNYL (SUBLIMAZE) injection    PRN Joycie Peek, CRNA   50 mcg at 03/15/11 0550  . DISCONTD: glycopyrrolate (ROBINUL) injection    PRN Joycie Peek, CRNA   0.6 mg at 03/15/11 0605  . DISCONTD: lactated ringers infusion    Continuous PRN Joycie Peek, CRNA      . DISCONTD: lidocaine (cardiac) 100 mg/83ml (XYLOCAINE) 20 MG/ML injection 2%    PRN Joycie Peek, CRNA   50 mg at 03/15/11 0513  . DISCONTD: midazolam (VERSED) 5 MG/5ML injection    PRN Joycie Peek, CRNA   2 mg at 03/15/11 0505  . DISCONTD: neostigmine (PROSTIGMINE) injection   Intravenous PRN Joycie Peek, CRNA   5 mg at 03/15/11 0605  . DISCONTD: ondansetron (ZOFRAN) injection    PRN  Joycie Peek, CRNA   4 mg at 03/15/11 0527  . DISCONTD: phenylephrine (NEO-SYNEPHRINE) injection    PRN Joycie Peek, CRNA   40 mcg at 03/15/11 0535  . DISCONTD: propofol (DIPRIVAN) 10 mg/mL bolus    PRN Joycie Peek, CRNA   180 mg at 03/15/11 0513  . DISCONTD: rocuronium (ZEMURON) injection    PRN Joycie Peek, CRNA   30 mg at 03/15/11 0520  . DISCONTD: succinylcholine (ANECTINE) injection    PRN Joycie Peek, CRNA   100 mg at 03/15/11 [phone]  Assessment/Plan Postoprative Diagnosis: #1 Appendicitis [541]  #2 Torsed infarcted appendix epiploica sigmoid colon/APPENDECTOMY LAPAROSCOPIC with excision of torsed appendix epiploica 03/15/11 0627 hrs. Back surgery    .  Total thyroidectomy    .  Cesarean section     Plan:  Will mobilize and she how she does.. She has a clear diet ordered.   LOS: 1 day    Florabel Faulks 03/15/2011

## 2011-03-15 NOTE — ED Notes (Signed)
Patient transported to X-ray 

## 2011-03-15 NOTE — ED Notes (Signed)
Pt returns to room 

## 2011-03-15 NOTE — ED Notes (Signed)
Pt provided CHG wipes with instruction

## 2011-03-15 NOTE — ED Notes (Signed)
Patient transported to CT 

## 2011-03-15 NOTE — Progress Notes (Signed)
Probably home tomorrow.  Yvonne Pierce. Corliss Skains, MD, [facility]  03/15/2011 10:44 AM

## 2011-03-15 NOTE — Op Note (Signed)
Preoperative Diagnosis: Appendicitis [541]   Postoprative Diagnosis: #1 Appendicitis [541]                                             #2 Torsed infarcted appendix epiploica sigmoid colon  Procedure: Procedure(s): APPENDECTOMY LAPAROSCOPIC with excision of torsed appendix epiploica   Surgeon: Glenna Fellows T   Assistants: none  Anesthesia:  General endotracheal anesthesia  Indications:  Patient is a 56 year old female who presents with 18 hours of persistent right lower quadrant abdominal pain. She has an elevated white count and CT scan of the abdomen in the emergency room showed evidence of acute appendicitis. She does have right lower quadrant tenderness although this is mild. I recommended proceeding with emergency laparoscopic appendectomy. The nature of the procedure and its indications as well as risks of anesthetic complications, bleeding, infection were discussed understood and she agrees to proceed.   Procedure Detail: Patient is brought to the operating room, placed in the supine position on the operating table and general endotracheal anesthesia induced. Foley catheter was placed. She received broad-spectrum preoperative IV antibiotics. PAS were in place. Patient timeout was performed and the procedure correct patient verified. The abdomen had been widely sterilely prepped and draped. Access was obtained with an open Hassan technique in the supraumbilical area due to obesity 1 cm incision and pneumoperitoneum established. Under direct vision a 5 mm trocar was placed in the right upper quadrant and a 12 mm trocar in the left lower quadrant. There were some midline the omental adhesions from her previous C-section that were taken down with the harmonic scalpel. The appendix was identified and was acutely inflamed with exudate but no evidence of gangrene or perforation. The appendix was mobile. It was elevated and the mesial appendix was sequentially divided with the harmonic  scalpel until the appendix was completely freed down to its base. The appendix was then divided at its with a single firing of the Endo GIA blue load 45 mm stapler. The appendix was placed in an Endo Catch bag and brought out through the umbilical incision. The abdomen was urinating and hemostasis assured. I noted in the left lower quadrant and appendix epiploica of the sigmoid colon that was torsed and necrotic it appeared somewhat acutely inflamed. I elected to excise this and it was easily removed with the harmonic scalpel staying away from the colon wall and it was placed in an Endo Catch bag and also brought out through the umbilical incision. The mattress suture previously placed of 0 Vicryl at the supraumbilical incision was secured. The skin incisions were closed with subcuticular Monocryl and Dermabond. Sponge needle and instrument counts were correct.   Findings: #1 acute appendicitis without perforation or gangrene #2 torsed necrotic appendix epiploica sigmoid colon  Estimated Blood Loss:  Minimal         Drains: none  Blood Given: none          Specimens: #1 appendix #2 appendix epiploica sigmoid colon        Complications:  * No complications entered in OR log *         Disposition: PACU - hemodynamically stable.         Condition: stable      Mariella Saa MD, FACS  03/15/2011, 6:26 AM

## 2011-03-15 NOTE — Progress Notes (Signed)
Pt has slightly reddened area on Right hand that is superficial, no swelling evident, pt denies any pain r/t to the site, is not warm to the touch. RN will continue to monitor the status of reddened area. Marcelino Duster, RN

## 2011-03-15 NOTE — Transfer of Care (Signed)
Immediate Anesthesia Transfer of Care Note  Patient: Yvonne Pierce  Procedure(s) Performed:  APPENDECTOMY LAPAROSCOPIC - Excision of infarcted torsion appendix epiploicae  Patient Location: PACU  Anesthesia Type: General  Level of Consciousness: sedated  Airway & Oxygen Therapy: Patient Spontanous Breathing and Patient connected to face mask oxygen  Post-op Assessment: Report given to PACU RN and Post -op Vital signs reviewed and stable  Post vital signs: Reviewed and stable  Complications: No apparent anesthesia complications

## 2011-03-15 NOTE — Anesthesia Preprocedure Evaluation (Addendum)
Anesthesia Evaluation  Patient identified by MRN, date of birth, ID band Patient awake    Reviewed: Allergy & Precautions, H&P , NPO status , Patient's Chart, lab work & pertinent test results  Airway Mallampati: III TM Distance: <3 FB Neck ROM: Full    Dental No notable dental hx.    Pulmonary neg pulmonary ROS,  clear to auscultation  Pulmonary exam normal       Cardiovascular neg cardio ROS Regular Normal    Neuro/Psych Negative Neurological ROS  Negative Psych ROS   GI/Hepatic negative GI ROS, Neg liver ROS,   Endo/Other  Hypothyroidism Morbid obesity  Renal/GU negative Renal ROS  Genitourinary negative   Musculoskeletal negative musculoskeletal ROS (+)   Abdominal   Peds negative pediatric ROS (+)  Hematology negative hematology ROS (+)   Anesthesia Other Findings   Reproductive/Obstetrics negative OB ROS                           Anesthesia Physical Anesthesia Plan  ASA: III and Emergent  Anesthesia Plan: General   Post-op Pain Management:    Induction: Intravenous and Rapid sequence  Airway Management Planned: Oral ETT  Additional Equipment:   Intra-op Plan:   Post-operative Plan: Extubation in OR  Informed Consent: I have reviewed the patients History and Physical, chart, labs and discussed the procedure including the risks, benefits and alternatives for the proposed anesthesia with the patient or authorized representative who has indicated his/her understanding and acceptance.   Dental advisory given  Plan Discussed with: CRNA  Anesthesia Plan Comments:        Anesthesia Quick Evaluation

## 2011-03-16 MED ORDER — OXYCODONE-ACETAMINOPHEN 5-325 MG PO TABS
1.0000 | ORAL_TABLET | ORAL | Status: AC | PRN
Start: 2011-03-16 — End: 2011-03-26

## 2011-03-16 NOTE — Progress Notes (Signed)
1 Day Post-Op  Subjective: Pt looks good, feels good. Ate well, no N/V Voiding normal. Wants to go home  Objective: Vital signs in last 24 hours: Temp:  [97.6 F (36.4 C)-99.1 F (37.3 C)] 97.6 F (36.4 C) (02/06 0540) Pulse Rate:  [62-86] 62  (02/06 0540) Resp:  [18] 18  (02/06 0540) BP: (113-152)/(63-74) 116/70 mmHg (02/06 0540) SpO2:  [93 %-98 %] 98 % (02/06 0540) Last BM Date: 03/16/11  Intake/Output this shift: Total I/O In: 240 [P.O.:240] Out: -   Physical Exam: BP 116/70  Pulse 62  Temp(Src) 97.6 F (36.4 C) (Oral)  Resp 18  Ht 5\' 3"  (1.6 m)  Wt 90.719 kg (200 lb)  BMI 35.43 kg/m2  SpO2 98% Lungs: CTA without w/r/r Heart: Regular Abdomen: soft, ND, appropriately tender   Incisions all c/d/i without erythema or hematoma. Ext: No edema or tenderness   Labs: CBC  Basename 03/14/11 2330  WBC 16.8*  HGB 14.2  HCT 42.7  PLT 310   BMET  Basename 03/14/11 2330  NA 140  K 4.0  CL 102  CO2 24  GLUCOSE 89  BUN 8  CREATININE 0.56  CALCIUM 8.0*   LFT  Basename 03/14/11 2330  PROT 7.7  ALBUMIN 4.0  AST 24  ALT 29  ALKPHOS 74  BILITOT 0.2*  BILIDIR --  IBILI --  LIPASE 32   PT/INR No results found for this basename: LABPROT:2,INR:2 in the last 72 hours ABG No results found for this basename: PHART:2,PCO2:2,PO2:2,HCO3:2 in the last 72 hours  Studies/Results: Dg Chest 2 View  03/15/2011  *RADIOLOGY REPORT*  Clinical Data: Preoperative radiograph  CHEST - 2 VIEW  Comparison: 10/11/2007 CT  Findings: Lungs are clear. No pleural effusion or pneumothorax. The cardiomediastinal contours are within normal limits. The visualized bones and soft tissues are without significant appreciable abnormality.  IMPRESSION: No acute cardiopulmonary process.  Original Report Authenticated By: Waneta Martins, M.D.   Ct Abdomen Pelvis W Contrast  03/15/2011  *RADIOLOGY REPORT*  Clinical Data: Right lower quadrant pain.  CT ABDOMEN AND PELVIS WITH CONTRAST   Technique:  Multidetector CT imaging of the abdomen and pelvis was performed following the standard protocol during bolus administration of intravenous contrast.  Contrast:  100 ml Omnipaque-300  Comparison: None.  Findings: Limited images through the lung bases demonstrate no significant appreciable abnormality. The heart size is within normal limits. No pleural or pericardial effusion.  Hepatic steatosis.  Unremarkable biliary system, spleen, pancreas, adrenal glands.  Symmetric renal enhancement.  No hydronephrosis or hydroureter.  No bowel obstruction.  No CT evidence for colitis.  Colonic diverticulosis.  The appendix is distended up to 1 cm with mild periappendiceal fat stranding.  No free intraperitoneal air or fluid.  No lymphadenopathy.  There is scattered atherosclerotic calcification of the aorta and its branches. No aneurysmal dilatation.  Thin-walled bladder.  Unremarkable appearance to the uterus and left adnexa.  8 mm peripherally calcified focus adjacent to the right ovary is nonspecific, may be vascular.  Surgical hardware at S1.  L5-S1 fusion. No acute osseous abnormality.  IMPRESSION: Acute appendicitis.  Hepatic steatosis.  Original Report Authenticated By: Waneta Martins, M.D.    Assessment: Active Problems:  Appendicitis, acute   Procedure(s): APPENDECTOMY LAPAROSCOPIC  Plan: DC home today  LOS: 2 days    Alyse Low 03/16/2011 9:48 AM

## 2011-03-16 NOTE — Progress Notes (Signed)
Patient ID: Yvonne Pierce, female   DOB: 1956-01-07, 56 y.o.   MRN: [id number] Patient was instructed on follow-up appointment details. Patient was educated on when to contact physician and provided contact numbers.  Patient stated understanding of all concerns that should be immediately addressed with physician. Patient was given and instructed on discharge medications and stated understanding. Co-signed by JBM,RN-BC, BSN

## 2011-03-16 NOTE — Discharge Summary (Signed)
Agree  Wilmon Arms. Corliss Skains, MD, [facility]  03/16/2011 11:37 AM

## 2011-03-16 NOTE — Progress Notes (Signed)
Agree with plans for discharge.  Wilmon Arms. Corliss Skains, MD, [facility]  03/16/2011 11:37 AM

## 2011-03-16 NOTE — Discharge Summary (Signed)
Physician Discharge Summary  Patient ID: Yvonne Pierce MRN: [id number] DOB/AGE: 57-15-1957 57 y.o.  Admit date: 03/14/2011 Discharge date: 03/16/2011  Admission Diagnoses: Appendicitis Discharge Diagnoses:  Active Problems:  Appendicitis, acute    Procedures: Procedure(s): APPENDECTOMY LAPAROSCOPIC  Discharged Condition: good  Hospital Course: HPI: patient is a 56 year old female who approximately 18 hours ago developed the gradual onset of right lower quadrant abdominal pain. She felt somewhat weak and tremulous at that time. The pain gradually increased and has been constant ever since. It is worse with motion and relieved with rest. She has had some nausea but no vomiting. Normal bowel movement without diarrhea or constipation or blood. No urinary symptoms. She was seen at [facility] urgent clinic and found to have an elevated white count and was referred to the emergency room for further evaluation. She denies any history of similar previous problems or chronic abdominal or GI complaints.   Admitted on 2/4 after seen by Dr. Johna Sheriff for appendicitis. Taken to the OR morning of 2/5 for lap appy. Tolerated well, no post-op issues or complications. Tolerating diet and voiding well. Stable for DC.   Consults: None   Discharge Exam: Blood pressure 116/70, pulse 62, temperature 97.6 F (36.4 C), temperature source Oral, resp. rate 18, height 5\' 3"  (1.6 m), weight 90.719 kg (200 lb), SpO2 98.00%. Lungs: CTA without w/r/r Heart: Regular Abdomen: soft, ND, appropriately tender   Incisions all c/d/i without erythema or hematoma. Ext: No edema or tenderness   Disposition: DC Home  Discharge Orders    Future Orders Please Complete By Expires   Diet general      Increase activity slowly      May shower / Bathe      No dressing needed      Call MD for:  redness, tenderness, or signs of infection (pain, swelling, redness, odor or green/yellow discharge around incision site)      Call MD  for:  severe uncontrolled pain      Call MD for:  persistant nausea and vomiting      Call MD for:  temperature >100.4        Medication List  As of 03/16/2011  9:53 AM   TAKE these medications         levothyroxine 175 MCG tablet   Commonly known as: SYNTHROID, LEVOTHROID   Take 175 mcg by mouth daily.      oxyCODONE-acetaminophen 5-325 MG per tablet   Commonly known as: PERCOCET   Take 1-2 tablets by mouth every 4 (four) hours as needed.           Follow-up Information    Follow up with CCS,MD, MD on 03/29/2011. ([facility] clinic 1:30pm)    Contact information:   [facility] [address] [place] [address] [phone]          Signed: Alyse Low 03/16/2011, 9:53 AM

## 2011-03-22 ENCOUNTER — Telehealth (INDEPENDENT_AMBULATORY_CARE_PROVIDER_SITE_OTHER): Payer: Self-pay

## 2011-03-22 NOTE — Telephone Encounter (Signed)
Pt called today to report her incision has been oozing blood the last two days.  No fever or pain. She is post op appy, and since she has already gone back to work I told her it was probably due to her activity level.  As long as bleeding was minimal and there were no other problems, I advised her to keep it covered with a clean dressing.  She will call us if anything changes, or she is still concerned.

## 2011-03-24 ENCOUNTER — Telehealth (INDEPENDENT_AMBULATORY_CARE_PROVIDER_SITE_OTHER): Payer: Self-pay

## 2011-03-24 NOTE — Telephone Encounter (Signed)
9:21am- C/O oozing from incision above navel area - pink blood tinged - after shower she noticed it on the floor- Patient advised to keep the are covered, clean and dry.  As of now the bandage is not soaked. No signs of infection reported. No fever. Please call patient with reply on [phone].

## 2011-03-25 NOTE — Telephone Encounter (Signed)
Called patient and instructed her to continue and keep area clean and dry and if she has any redness or swelling, warm to touch or increase in drainage to call our office to schedule appointment for further assessment.

## 2011-03-29 ENCOUNTER — Ambulatory Visit (INDEPENDENT_AMBULATORY_CARE_PROVIDER_SITE_OTHER): Payer: Managed Care, Other (non HMO) | Admitting: Radiology

## 2011-03-29 ENCOUNTER — Encounter (HOSPITAL_COMMUNITY): Payer: Self-pay | Admitting: General Surgery

## 2011-03-29 VITALS — BP 128/82 | HR 92 | Temp 98.3°F | Resp 24 | Ht 62.0 in | Wt 213.4 lb

## 2011-03-29 DIAGNOSIS — Z9049 Acquired absence of other specified parts of digestive tract: Secondary | ICD-10-CM

## 2011-03-29 DIAGNOSIS — Z9889 Other specified postprocedural states: Secondary | ICD-10-CM

## 2011-03-29 NOTE — Progress Notes (Signed)
Yvonne Pierce 1955-08-14 [id number] 03/29/2011   History of Present Illness: Yvonne Pierce is a  56 y.o. female who presents today status post lap appy by Dr. Johna Sheriff.  Pathology reveals appendicitis.  The patient is tolerating a regular diet, having normal bowel movements, has good pain control.  She  is back to most normal activities.   Physical Exam: Abd: soft, nontender, active bowel sounds, nondistended.  All incisions look good. Her umbilical incision did separate some but it is sealed off now and no evidence of infection.  Impression: 1.  Acute appendicitis, s/p lap appy  Plan: She  is able to return to normal activities. She  may follow up on a prn basis.   Marianna Fuss PA-C 03/29/2011 1:47 PM

## 2013-01-02 IMAGING — CT CT ABD-PELV W/ CM
1 of 3 series · 14 of 32 positions shown, 19 images · IV contrast (100 ML OMNI 300)
Comparison: None.

CLINICAL DATA: Right lower quadrant pain.

CT ABDOMEN AND PELVIS WITH CONTRAST
TECHNIQUE: Multidetector CT imaging of the abdomen and pelvis was
performed following the standard protocol during bolus
administration of intravenous contrast.
Contrast:  100 ml Hmnipaque-1LL

[Series 2: abd/pel with · axial · 0.74mm/px · z∈[+1222,+1582]mm · 14 of 82 slices shown, 19 images]
[im 5/82  soft-tissue]
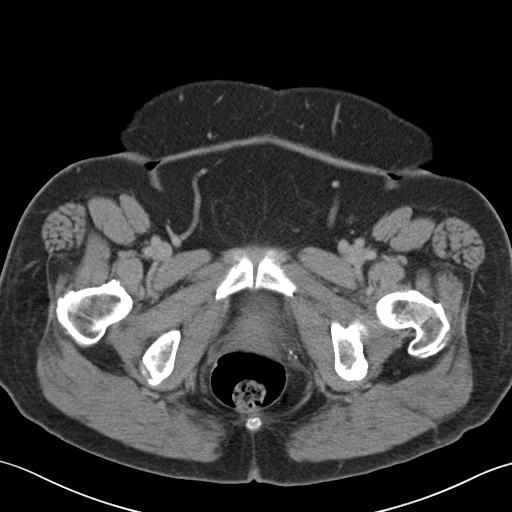
[im 5/82  bone]
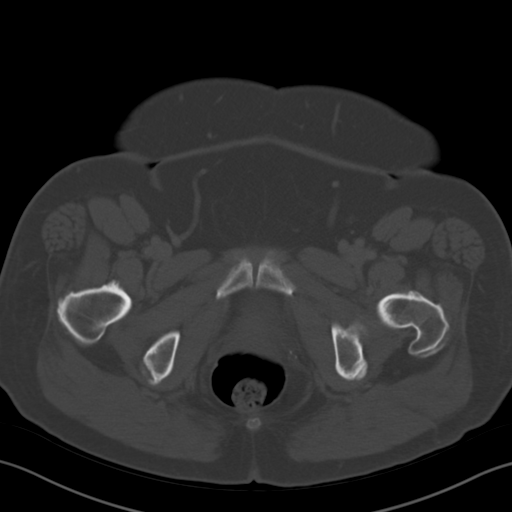
[im 10/82  soft-tissue]
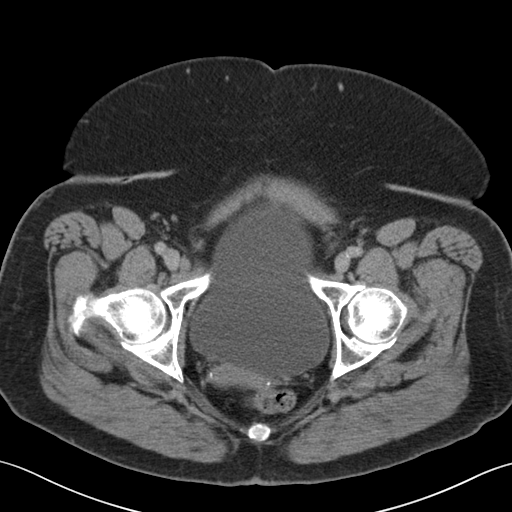
[im 19/82  soft-tissue]
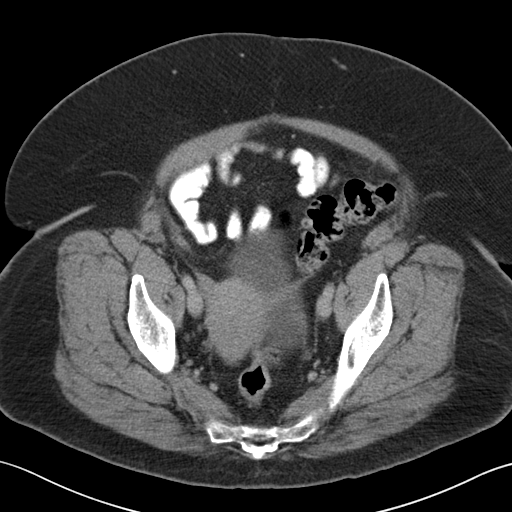
[im 23/82  soft-tissue]
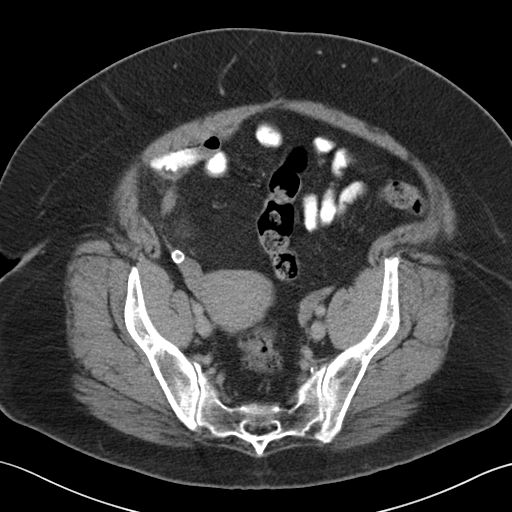
[im 28/82  soft-tissue]
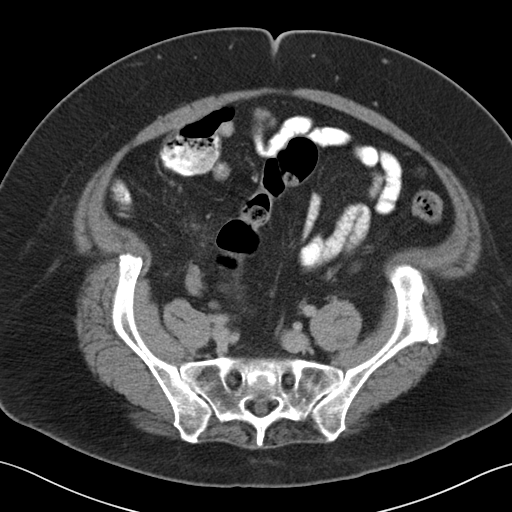
[im 37/82  soft-tissue]
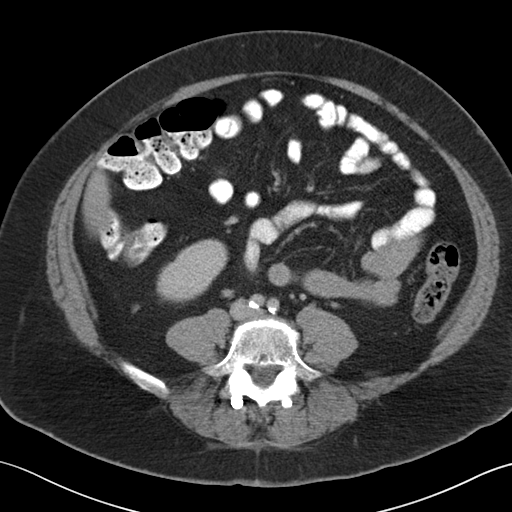
[im 41/82  soft-tissue]
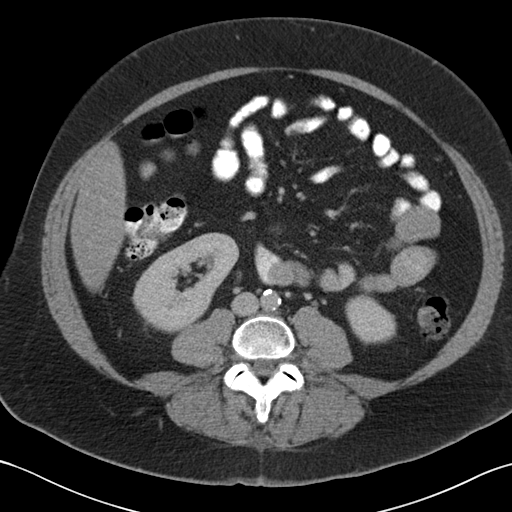
[im 46/82  soft-tissue]
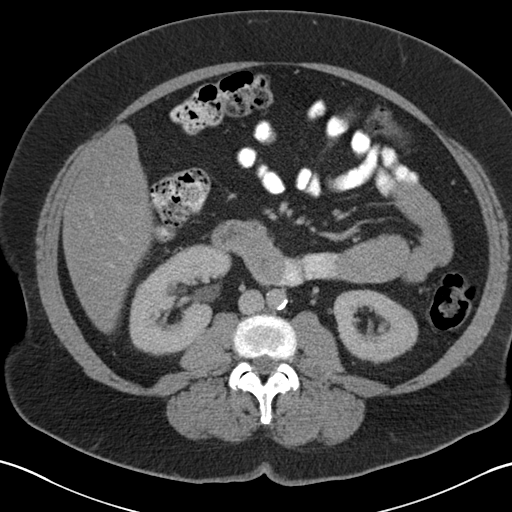
[im 55/82  soft-tissue]
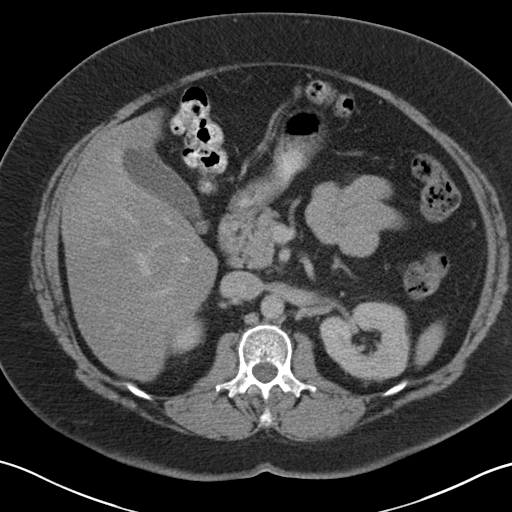
[im 55/82  bone]
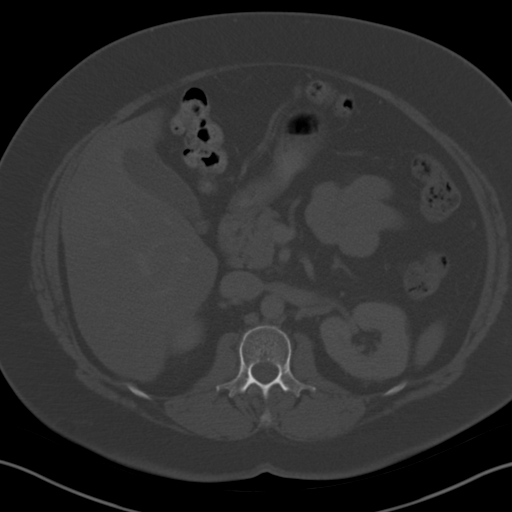
[im 59/82  soft-tissue]
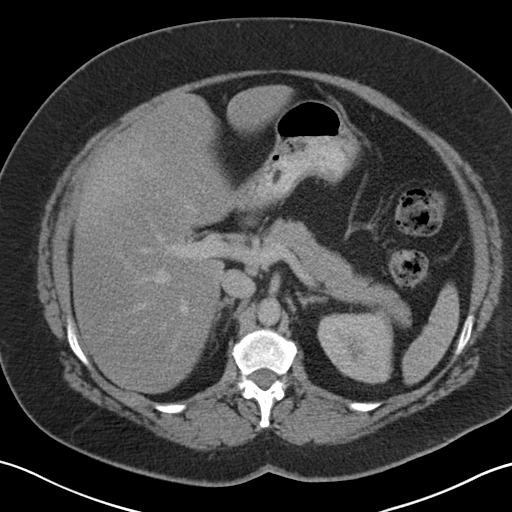
[im 64/82  soft-tissue]
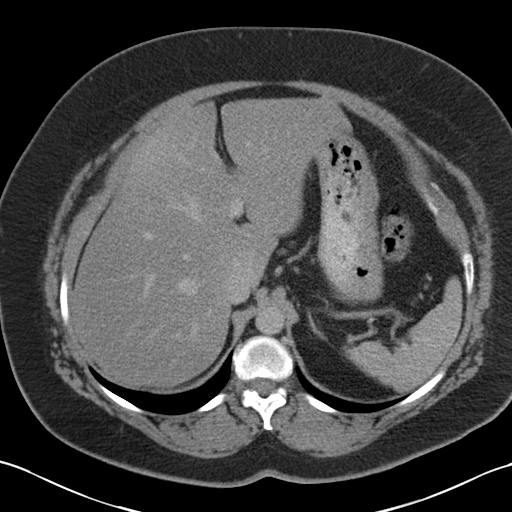
[im 64/82  lung]
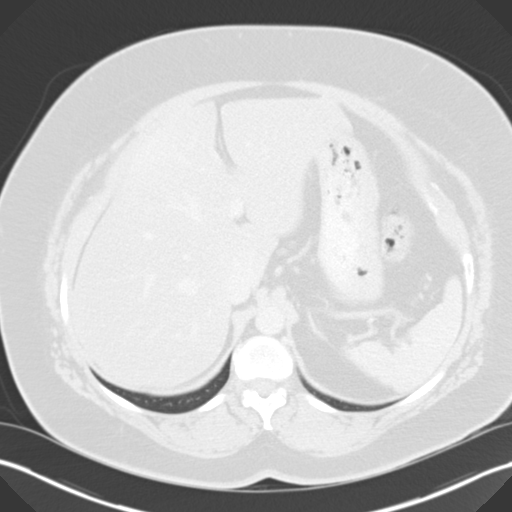
[im 68/82  lung]
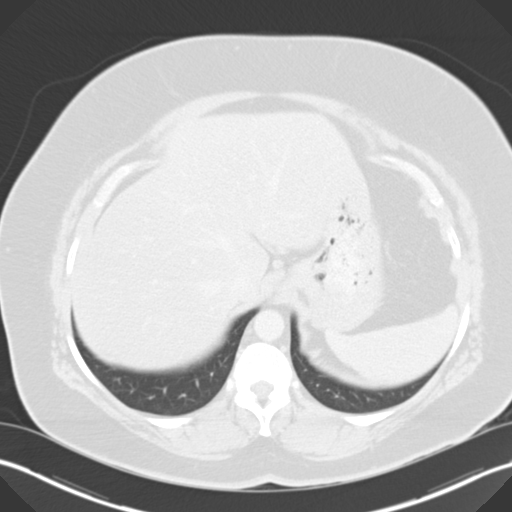
[im 73/82  soft-tissue]
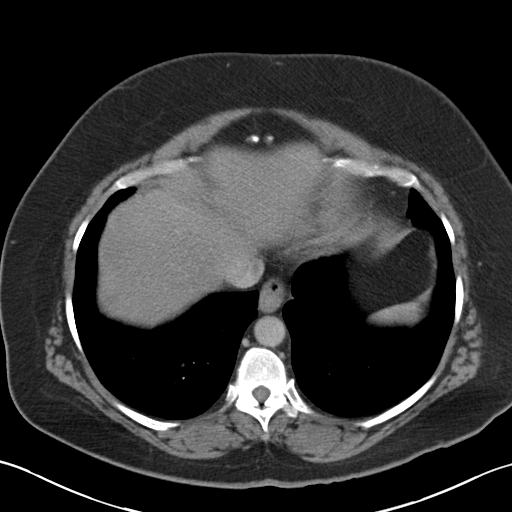
[im 73/82  lung]
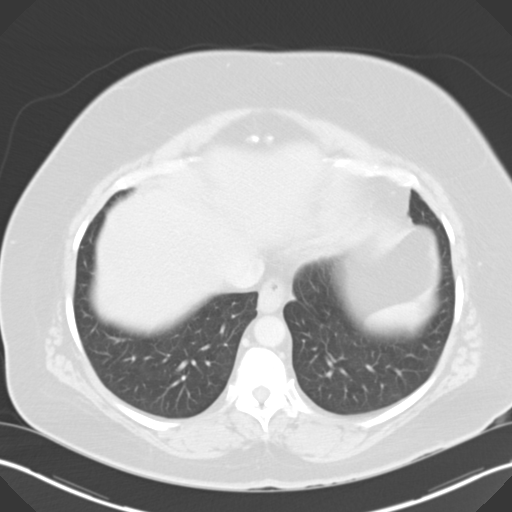
[im 77/82  soft-tissue]
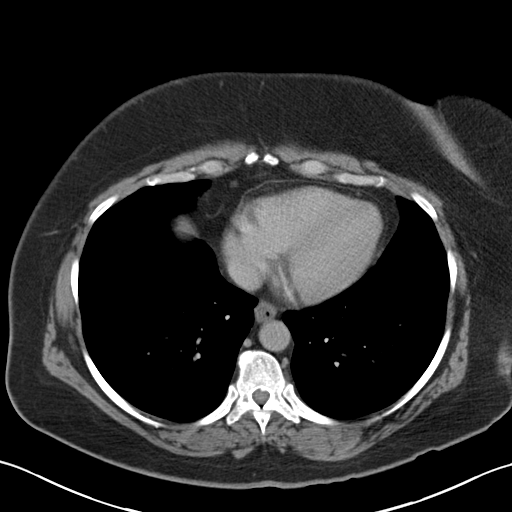
[im 77/82  lung]
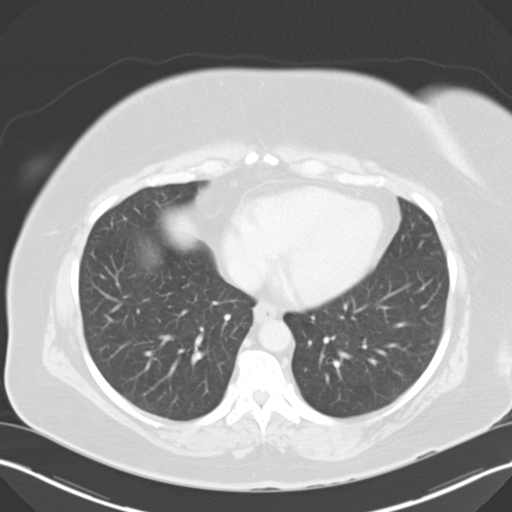

[14 of 32 positions shown; findings below may reference images not displayed]

FINDINGS: Limited images through the lung bases demonstrate no
significant appreciable abnormality. The heart size is within
normal limits. No pleural or pericardial effusion.

Hepatic steatosis.  Unremarkable biliary system, spleen, pancreas,
adrenal glands.  Symmetric renal enhancement.  No hydronephrosis or
hydroureter.

No bowel obstruction.  No CT evidence for colitis.  Colonic
diverticulosis.  The appendix is distended up to 1 cm with mild
periappendiceal fat stranding.  No free intraperitoneal air or
fluid.  No lymphadenopathy.

There is scattered atherosclerotic calcification of the aorta and
its branches. No aneurysmal dilatation.

Thin-walled bladder.  Unremarkable appearance to the uterus and
left adnexa.  8 mm peripherally calcified focus adjacent to the
right ovary is nonspecific, may be vascular.

Surgical hardware at S1.  L5-S1 fusion. No acute osseous
abnormality.
IMPRESSION: Acute appendicitis.

Hepatic steatosis.

## 2013-01-02 IMAGING — CR DG CHEST 2V
2 series · 2 of 2 positions shown · non-contrast
Comparison: 10/11/2007 CT

CLINICAL DATA: Preoperative radiograph

CHEST - 2 VIEW

[w chest pa]
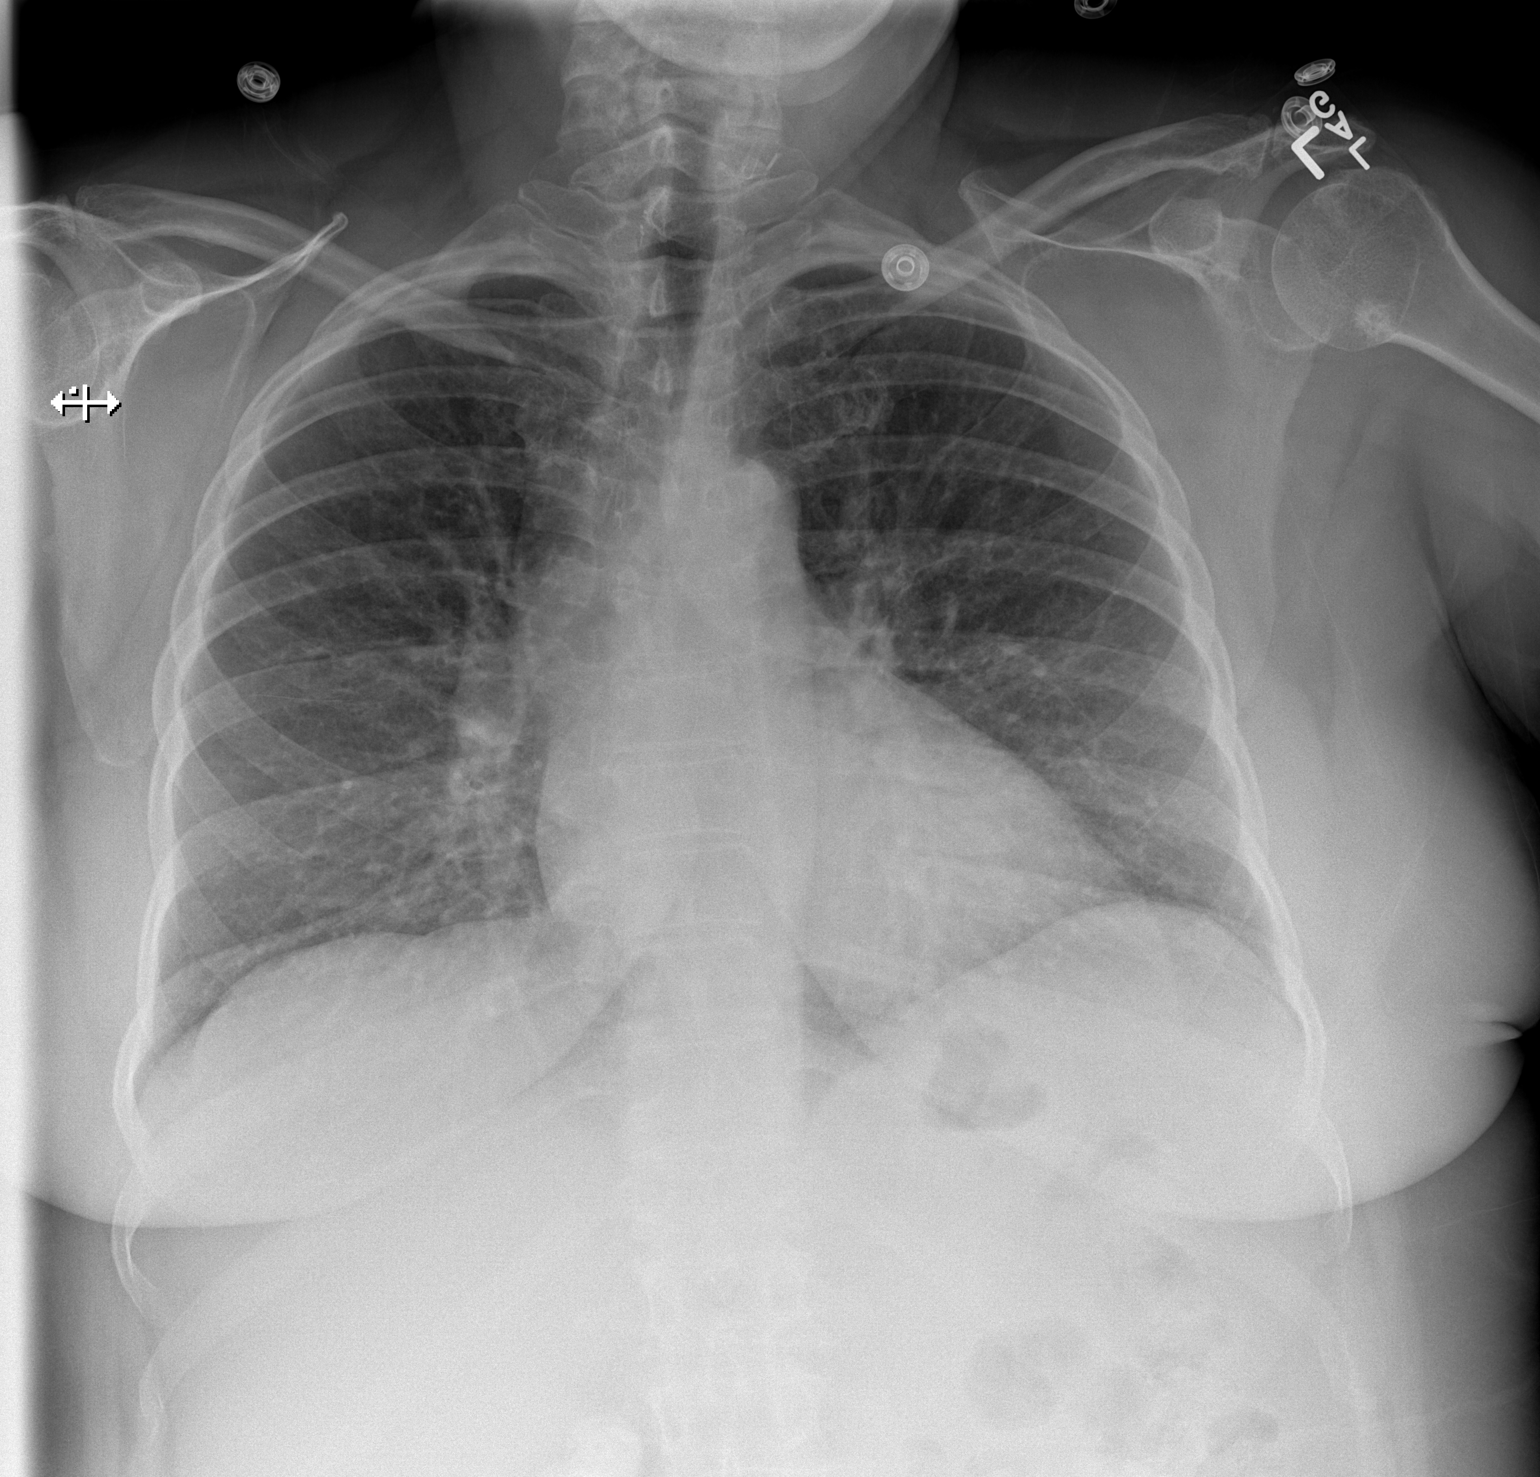

[w chest lat]
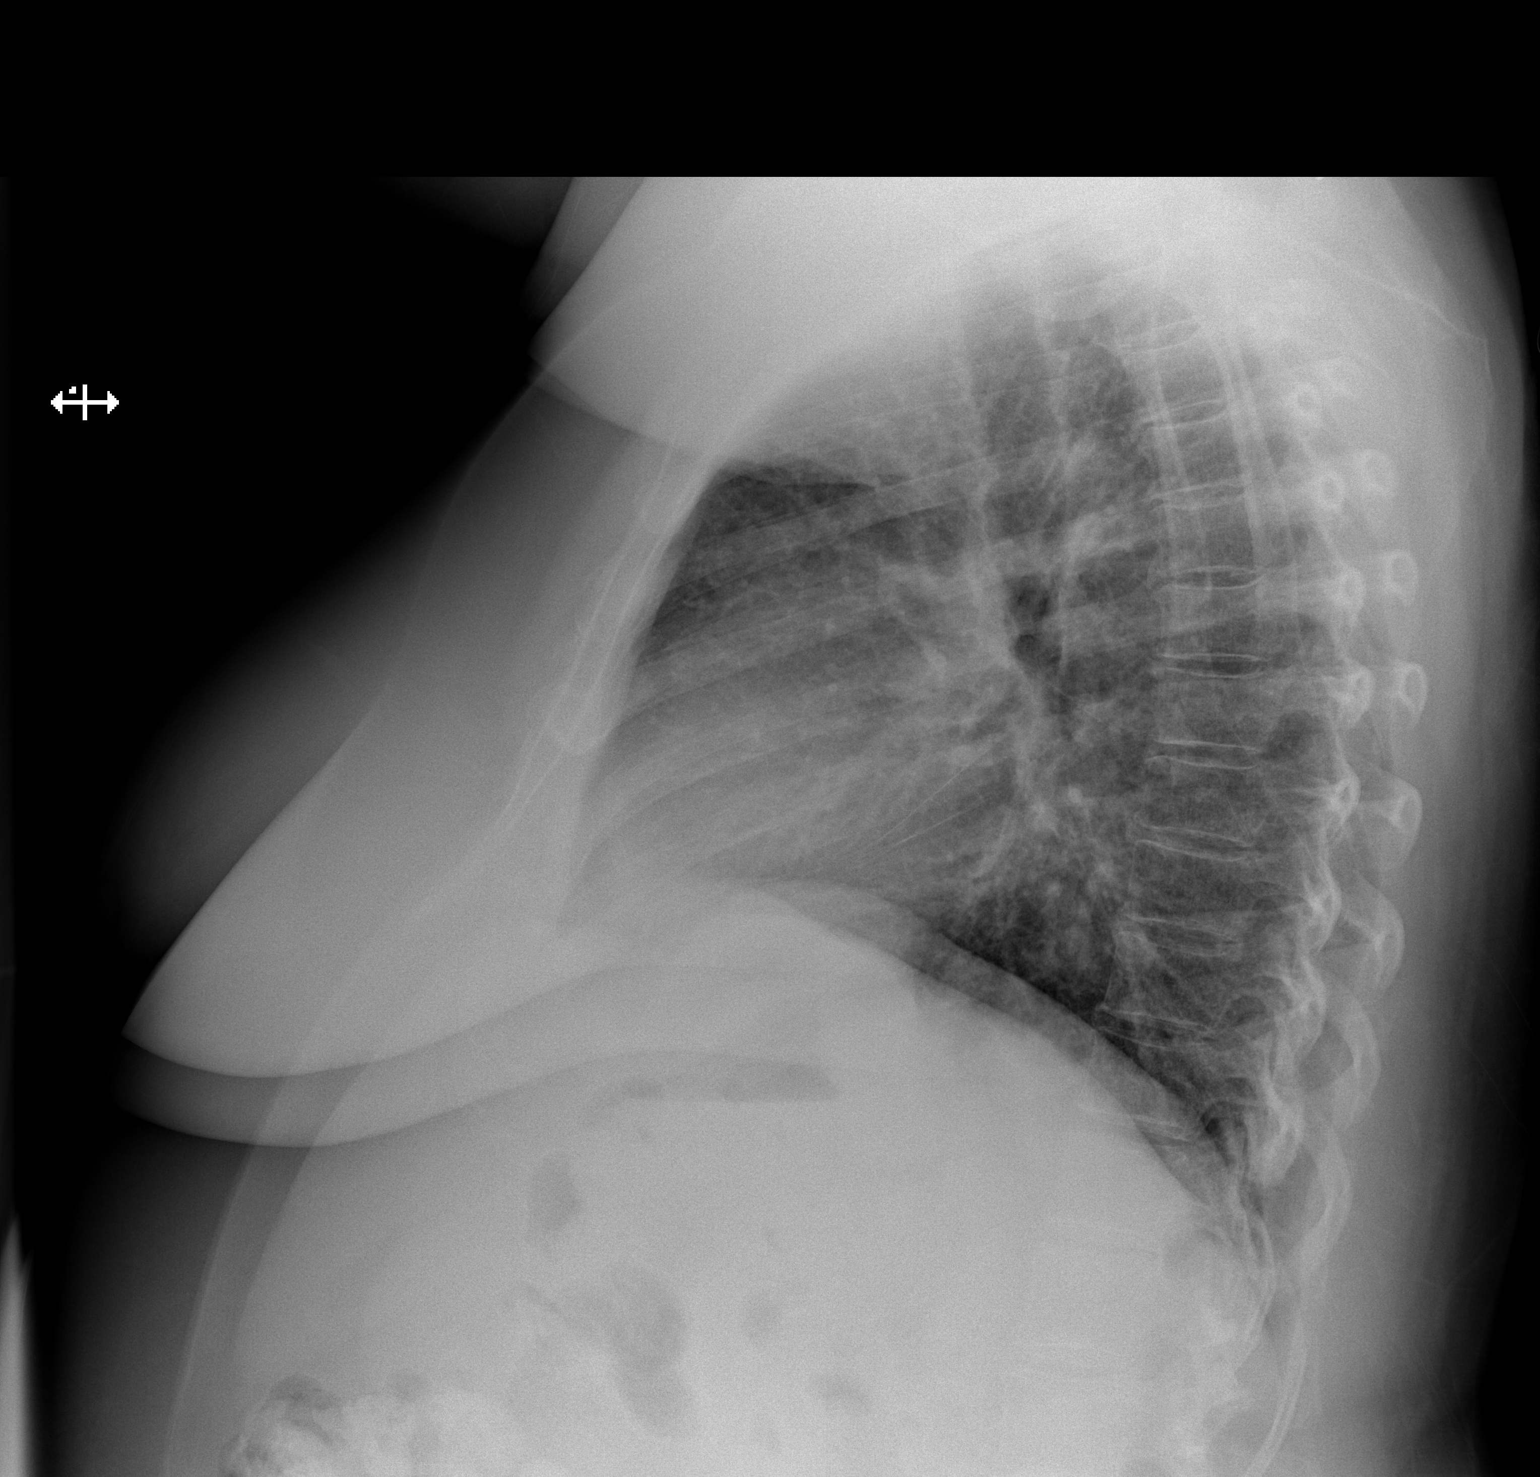

[2 of 2 positions shown; findings below may reference images not displayed]

FINDINGS: Lungs are clear. No pleural effusion or pneumothorax. The
cardiomediastinal contours are within normal limits. The visualized
bones and soft tissues are without significant appreciable
abnormality.
IMPRESSION: No acute cardiopulmonary process.

## 2013-02-08 ENCOUNTER — Encounter: Payer: Self-pay | Admitting: Cardiology

## 2013-03-22 ENCOUNTER — Ambulatory Visit: Payer: Managed Care, Other (non HMO) | Admitting: Cardiology

## 2013-04-09 ENCOUNTER — Encounter: Payer: Self-pay | Admitting: General Surgery

## 2013-04-09 DIAGNOSIS — G4733 Obstructive sleep apnea (adult) (pediatric): Secondary | ICD-10-CM | POA: Insufficient documentation

## 2013-04-10 ENCOUNTER — Ambulatory Visit: Payer: Managed Care, Other (non HMO) | Admitting: Cardiology

## 2013-04-30 ENCOUNTER — Encounter: Payer: Self-pay | Admitting: Cardiology

## 2013-05-01 ENCOUNTER — Ambulatory Visit (INDEPENDENT_AMBULATORY_CARE_PROVIDER_SITE_OTHER): Payer: Managed Care, Other (non HMO) | Admitting: Cardiology

## 2013-05-01 ENCOUNTER — Encounter: Payer: Self-pay | Admitting: Cardiology

## 2013-05-01 VITALS — BP 130/70 | HR 76 | Ht 63.0 in | Wt 219.0 lb

## 2013-05-01 DIAGNOSIS — G4733 Obstructive sleep apnea (adult) (pediatric): Secondary | ICD-10-CM

## 2013-05-01 DIAGNOSIS — E669 Obesity, unspecified: Secondary | ICD-10-CM

## 2013-05-01 NOTE — Patient Instructions (Signed)
Your physician wants you to follow-up in: 6 months with Dr. Turner. You will receive a reminder letter in the mail two months in advance. If you don't receive a letter, please call our office to schedule the follow-up appointment.  Your physician recommends that you continue on your current medications as directed. Please refer to the Current Medication list given to you today.  

## 2013-05-01 NOTE — Progress Notes (Signed)
  [place], [address] [place], [place]  [address] Phone: [phone] Fax:  [phone]  Date:  05/01/2013   ID:  Yvonne Pierce, DOB April 11, 1955, MRN [id number]  PCP:  Abigail Miyamoto, MD  Sleep Medicine:  Fransico Him, MD   History of Present Illness: Yvonne Pierce is a 58 y.o. female with a history of OSA and obesity who presents today for followup.  She is doing well.  She tolerates her device without any problems.  She tolerates the nasal mask and feels that pressure is adequate.  She feels rested in the am and has no daytime sleepiness.  She walks 2 miles 3 days weekly.   Wt Readings from Last 3 Encounters:  05/01/13 219 lb (99.338 kg)  03/29/11 213 lb 6.4 oz (96.798 kg)  03/14/11 200 lb (90.719 kg)     Past Medical History  Diagnosis Date  . Thyroid disease   . Cancer     thyroid  . OSA (obstructive sleep apnea)     w AHI 14.4/hr now on CPAP at 9cm H2O  . Obesity (BMI 30-39.9)     Current Outpatient Prescriptions  Medication Sig Dispense Refill  . levothyroxine (SYNTHROID, LEVOTHROID) 175 MCG tablet Take 175 mcg by mouth daily.      Marland Kitchen UNABLE TO FIND CPAP       No current facility-administered medications for this visit.    Allergies:   No Known Allergies  Social History:  The patient  reports that she has been smoking Cigarettes.  She has been smoking about 1.00 pack per day. She does not have any smokeless tobacco history on file. She reports that she does not drink alcohol or use illicit drugs.   Family History:  The patient's family history includes Diabetes in her mother; Hypertension in her father.   ROS:  Please see the history of present illness.      All other systems reviewed and negative.   PHYSICAL EXAM: VS:  BP 130/70  Pulse 76  Ht 5\' 3"  (1.6 m)  Wt 219 lb (99.338 kg)  BMI 38.80 kg/m2 Well nourished, well developed, in no acute distress HEENT: normal Neck: no JVD Cardiac:  normal S1, S2; RRR; no murmur Lungs:  clear to auscultation bilaterally,  no wheezing, rhonchi or rales Abd: soft, nontender, no hepatomegaly Ext: no edema Skin: warm and dry Neuro:  CNs 2-12 intact, no focal abnormalities noted       ASSESSMENT AND PLAN:  1. OSA on CPAP - her download today showed an AHi of 1.2/hr on 9cm H2O and 87% compliance in using more then 4 hours nightly.  She will continue on current settings. 2. Obesity - I encouraged her to increase the days she exercises.  Followup with me in 6 months  Signed, Fransico Him, MD 05/01/2013 9:17 AM

## 2013-08-29 ENCOUNTER — Encounter: Payer: Self-pay | Admitting: Cardiology

## 2014-05-15 ENCOUNTER — Encounter: Payer: Self-pay | Admitting: *Deleted

## 2015-10-02 ENCOUNTER — Encounter: Payer: Self-pay | Admitting: Cardiology

## 2015-10-16 ENCOUNTER — Ambulatory Visit: Payer: Managed Care, Other (non HMO) | Admitting: Cardiology

## 2015-10-22 ENCOUNTER — Ambulatory Visit: Payer: Managed Care, Other (non HMO) | Admitting: Physician Assistant

## 2015-12-04 ENCOUNTER — Ambulatory Visit: Payer: Managed Care, Other (non HMO) | Admitting: Cardiology

## 2016-02-16 ENCOUNTER — Ambulatory Visit: Payer: Managed Care, Other (non HMO) | Admitting: Cardiology

## 2016-03-11 ENCOUNTER — Ambulatory Visit: Payer: Managed Care, Other (non HMO) | Admitting: Cardiology

## 2016-05-15 ENCOUNTER — Emergency Department (HOSPITAL_BASED_OUTPATIENT_CLINIC_OR_DEPARTMENT_OTHER): Payer: Managed Care, Other (non HMO)

## 2016-05-15 ENCOUNTER — Emergency Department (HOSPITAL_BASED_OUTPATIENT_CLINIC_OR_DEPARTMENT_OTHER)
Admission: EM | Admit: 2016-05-15 | Discharge: 2016-05-15 | Disposition: A | Discharge status: Home / Self Care | Payer: Managed Care, Other (non HMO) | Attending: Emergency Medicine | Admitting: Emergency Medicine

## 2016-05-15 ENCOUNTER — Encounter (HOSPITAL_BASED_OUTPATIENT_CLINIC_OR_DEPARTMENT_OTHER): Payer: Self-pay | Admitting: *Deleted

## 2016-05-15 DIAGNOSIS — F17210 Nicotine dependence, cigarettes, uncomplicated: Secondary | ICD-10-CM | POA: Diagnosis not present

## 2016-05-15 DIAGNOSIS — N950 Postmenopausal bleeding: Secondary | ICD-10-CM

## 2016-05-15 DIAGNOSIS — N939 Abnormal uterine and vaginal bleeding, unspecified: Secondary | ICD-10-CM | POA: Diagnosis present

## 2016-05-15 LAB — BASIC METABOLIC PANEL
ANION GAP: 9 (ref 5–15)
BUN: 15 mg/dL (ref 6–20)
CO2: 25 mmol/L (ref 22–32)
Calcium: 8.1 mg/dL — ABNORMAL LOW (ref 8.9–10.3)
Chloride: 103 mmol/L (ref 101–111)
Creatinine, Ser: 0.91 mg/dL (ref 0.44–1.00)
GFR calc Af Amer: >60 mL/min (ref >60)
GFR calc non Af Amer: >60 mL/min (ref >60)
GLUCOSE: 109 mg/dL — AB (ref 65–99)
Potassium: 3.5 mmol/L (ref 3.5–5.1)
SODIUM: 137 mmol/L (ref 135–145)

## 2016-05-15 LAB — CBC WITH DIFFERENTIAL/PLATELET
BASOS PCT: 0 %
Basophils Absolute: 0 10*3/uL (ref 0.0–0.1)
EOS PCT: 1 %
Eosinophils Absolute: 0.1 10*3/uL (ref 0.0–0.7)
HCT: 42 % (ref 36.0–46.0)
Hemoglobin: 14.4 g/dL (ref 12.0–15.0)
LYMPHS ABS: 3.2 10*3/uL (ref 0.7–4.0)
Lymphocytes Relative: 33 %
MCH: 30.6 pg (ref 26.0–34.0)
MCHC: 34.3 g/dL (ref 30.0–36.0)
MCV: 89.4 fL (ref 78.0–100.0)
MONOS PCT: 8 %
Monocytes Absolute: 0.8 10*3/uL (ref 0.1–1.0)
NEUTROS PCT: 58 %
Neutro Abs: 5.5 10*3/uL (ref 1.7–7.7)
PLATELETS: 282 10*3/uL (ref 150–400)
RBC: 4.7 MIL/uL (ref 3.87–5.11)
RDW: 13.3 % (ref 11.5–15.5)
WBC: 9.7 10*3/uL (ref 4.0–10.5)

## 2016-05-15 MED ORDER — IBUPROFEN 400 MG PO TABS
400.0000 mg | ORAL_TABLET | Freq: Once | ORAL | Status: AC
  Administered 2016-05-15: 400 mg via ORAL
  Filled 2016-05-15: qty 1

## 2016-05-15 NOTE — ED Provider Notes (Signed)
[facility] Provider Note   CSN: [id number] Arrival date & time: 05/15/16  1711  By signing my name below, I, Jaquelyn Bitter., attest that this documentation has been prepared under the direction and in the presence of Malvin Johns, MD. Electronically signed: Jaquelyn Bitter., ED Scribe. 05/15/16. 8:27 PM.   History   Chief Complaint Chief Complaint  Patient presents with  . Vaginal Bleeding    HPI Yvonne Pierce is a 61 y.o. female who presents to the Emergency Department complaining of vaginal bleeding with onset x6 hours ago. Pt states that x6 hours ago she began experiencing abdominal cramps, followed by vaginal bleeding. She reports nausea, abdominal pain and diarrhea. She denies any modifying factors. Pt denies nausea, vomiting, blood in stool, difficulty urinating, dizziness, SOB, chest pain and fatigue. Of note, pt has not had a menstrual in 10 years due to menopause.   The history is provided by the patient. No language interpreter was used.    Past Medical History:  Diagnosis Date  . Cancer ([place])    thyroid  . Obesity (BMI 30-39.9)   . OSA (obstructive sleep apnea)    w AHI 14.4/hr now on CPAP at 9cm H2O  . Thyroid disease     Patient Active Problem List   Diagnosis Date Noted  . Obesity (BMI 30-39.9)   . OSA (obstructive sleep apnea) 04/09/2013  . Appendicitis, acute 03/15/2011    Past Surgical History:  Procedure Laterality Date  . BACK SURGERY    . CESAREAN SECTION    . LAPAROSCOPIC APPENDECTOMY  03/15/2011   Procedure: APPENDECTOMY LAPAROSCOPIC;  Surgeon: Edward Jolly, MD;  Location: [facility];  Service: General;  Laterality: N/A;  Excision of infarcted torsion appendix epiploicae  . TOTAL THYROIDECTOMY      OB History    No data available       Home Medications    Prior to Admission medications   Medication Sig Start Date End Date Taking? Authorizing Provider  CALCITRIOL PO Take by mouth.   Yes Historical  Provider, MD  levothyroxine (SYNTHROID, LEVOTHROID) 175 MCG tablet Take 175 mcg by mouth daily.   Yes Historical Provider, MD  Varenicline Tartrate (CHANTIX PO) Take by mouth.   Yes Historical Provider, MD  UNABLE TO FIND CPAP    Historical Provider, MD    Family History Family History  Problem Relation Age of Onset  . Diabetes Mother   . Hypertension Father     Social History Social History  Substance Use Topics  . Smoking status: Current Some Day Smoker    Packs/day: 1.00    Types: Cigarettes  . Smokeless tobacco: Never Used  . Alcohol use No     Allergies   Patient has no known allergies.   Review of Systems Review of Systems  Constitutional: Negative for chills, diaphoresis, fatigue and fever.  HENT: Negative for congestion, rhinorrhea and sneezing.   Eyes: Negative.   Respiratory: Negative for cough, chest tightness and shortness of breath.   Cardiovascular: Negative for chest pain and leg swelling.  Gastrointestinal: Positive for diarrhea and nausea. Negative for abdominal pain, blood in stool and vomiting.  Genitourinary: Positive for vaginal bleeding. Negative for difficulty urinating, flank pain, frequency and hematuria.  Musculoskeletal: Negative for arthralgias and back pain.  Skin: Negative for rash.  Neurological: Negative for dizziness, speech difficulty, weakness, numbness and headaches.     Physical Exam Updated Vital Signs BP (!) 156/76 (BP Location: Right Arm)  Pulse 68   Temp 98.3 F (36.8 C) (Oral)   Resp 14   Ht 5\' 3"  (1.6 m)   Wt 217 lb (98.4 kg)   SpO2 97%   BMI 38.44 kg/m   Physical Exam  Constitutional: She is oriented to person, place, and time. She appears well-developed and well-nourished.  HENT:  Head: Normocephalic and atraumatic.  Eyes: Pupils are equal, round, and reactive to light.  Neck: Normal range of motion. Neck supple.  Cardiovascular: Normal rate, regular rhythm and normal heart sounds.   Pulmonary/Chest: Effort  normal and breath sounds normal. No respiratory distress. She has no wheezes. She has no rales. She exhibits no tenderness.  Abdominal: Soft. Bowel sounds are normal. There is no tenderness. There is no rebound and no guarding.  Genitourinary:  Genitourinary Comments: Positive dark blood in the vault. No active bleeding. No cervical motion tenderness, no masses, no discharge  Musculoskeletal: Normal range of motion. She exhibits no edema.  Lymphadenopathy:    She has no cervical adenopathy.  Neurological: She is alert and oriented to person, place, and time.  Skin: Skin is warm and dry. No rash noted.  Psychiatric: She has a normal mood and affect.     ED Treatments / Results   DIAGNOSTIC STUDIES: Oxygen Saturation is 98% on RA, normal by my interpretation.   COORDINATION OF CARE: 8:27 PM-Discussed next steps with pt. Pt verbalized understanding and is agreeable with the plan.    Labs (all labs ordered are listed, but only abnormal results are displayed) Labs Reviewed  BASIC METABOLIC PANEL - Abnormal; Notable for the following:       Result Value   Glucose, Bld 109 (*)    Calcium 8.1 (*)    All other components within normal limits  CBC WITH DIFFERENTIAL/PLATELET    EKG  EKG Interpretation None       Radiology US Transvaginal Non-ob  Result Date: 05/15/2016 CLINICAL DATA:  61 year old female with sudden onset postmenopausal bleeding this morning. Pelvic cramping. EXAM: TRANSABDOMINAL AND TRANSVAGINAL ULTRASOUND OF PELVIS TECHNIQUE: Both transabdominal and transvaginal ultrasound examinations of the pelvis were performed. Transabdominal technique was performed for global imaging of the pelvis including uterus, ovaries, adnexal regions, and pelvic cul-de-sac. It was necessary to proceed with endovaginal exam following the transabdominal exam to visualize the endometrium and adnexa. COMPARISON:  None FINDINGS: Uterus Measurements: 11.0 x 5.0 x 5.5 cm. Anteverted uterus. No  uterine fibroids or other myometrial abnormality is demonstrated. Endometrium Thickness: 17 mm. Abnormally thickened heterogeneous endometrium with endometrial vascularity on color Doppler. No discrete mass . Right ovary Nonvisualization of the right ovary. No right adnexal mass demonstrated. Left ovary Nonvisualization of the left ovary. No left adnexal mass demonstrated. Other findings No abnormal free fluid. IMPRESSION: 1. Abnormally thickened (17 mm) heterogeneous endometrium. In the setting of post-menopausal bleeding, endometrial sampling is indicated to exclude carcinoma. If results are benign, sonohysterogram should be considered for focal lesion work-up. (Ref: Radiological Reasoning: Algorithmic Workup of Abnormal Vaginal Bleeding with Endovaginal Sonography and Sonohysterography. AJR 2008; 782:N56-21). 2. Nonvisualization of the ovaries bilaterally.  No adnexal masses. Electronically Signed   By: Ilona Sorrel M.D.   On: 05/15/2016 19:49   US Pelvis Complete  Result Date: 05/15/2016 CLINICAL DATA:  61 year old female with sudden onset postmenopausal bleeding this morning. Pelvic cramping. EXAM: TRANSABDOMINAL AND TRANSVAGINAL ULTRASOUND OF PELVIS TECHNIQUE: Both transabdominal and transvaginal ultrasound examinations of the pelvis were performed. Transabdominal technique was performed for global imaging of the pelvis including uterus,  ovaries, adnexal regions, and pelvic cul-de-sac. It was necessary to proceed with endovaginal exam following the transabdominal exam to visualize the endometrium and adnexa. COMPARISON:  None FINDINGS: Uterus Measurements: 11.0 x 5.0 x 5.5 cm. Anteverted uterus. No uterine fibroids or other myometrial abnormality is demonstrated. Endometrium Thickness: 17 mm. Abnormally thickened heterogeneous endometrium with endometrial vascularity on color Doppler. No discrete mass . Right ovary Nonvisualization of the right ovary. No right adnexal mass demonstrated. Left ovary  Nonvisualization of the left ovary. No left adnexal mass demonstrated. Other findings No abnormal free fluid. IMPRESSION: 1. Abnormally thickened (17 mm) heterogeneous endometrium. In the setting of post-menopausal bleeding, endometrial sampling is indicated to exclude carcinoma. If results are benign, sonohysterogram should be considered for focal lesion work-up. (Ref: Radiological Reasoning: Algorithmic Workup of Abnormal Vaginal Bleeding with Endovaginal Sonography and Sonohysterography. AJR 2008; 579:J28-20). 2. Nonvisualization of the ovaries bilaterally.  No adnexal masses. Electronically Signed   By: Ilona Sorrel M.D.   On: 05/15/2016 19:49    Procedures Procedures (including critical care time)  Medications Ordered in ED Medications  ibuprofen (ADVIL,MOTRIN) tablet 400 mg (400 mg Oral Given 05/15/16 1803)     Initial Impression / Assessment and Plan / ED Course  I have reviewed the triage vital signs and the nursing notes.  Pertinent labs & imaging results that were available during my care of the patient were reviewed by me and considered in my medical decision making (see chart for details).     Patient presents with postmenopausal vaginal bleeding. There is no active bleeding currently. Her endometrium is thickened on ultrasound. Her blood work is non-concerning. I encouraged close outpatient follow-up with an OB/GYN. Return precautions were given.  Final Clinical Impressions(s) / ED Diagnoses   Final diagnoses:  Postmenopausal bleeding    New Prescriptions New Prescriptions   No medications on file   I personally performed the services described in this documentation, which was scribed in my presence.  The recorded information has been reviewed and considered.     Malvin Johns, MD 05/15/16 2027

## 2016-05-15 NOTE — ED Notes (Signed)
Pt given d/c instructions as per chart. Verbalizes understanding. No questions. 

## 2016-05-15 NOTE — ED Triage Notes (Signed)
Pt reports small amount of vaginal bleeding that began today with accompanying stomach cramps. Denies fever, v/d. Also reports nausea.

## 2016-05-15 NOTE — ED Notes (Signed)
Pt transported to US

## 2016-05-15 NOTE — ED Notes (Signed)
Pt c/o bleeding from between her legs since this morning.  Pt unsure if vaginal, rectal or urethral bleeding but states it has been slow but constant bleeding.  Pt also reports lower abdominal cramping since this am.

## 2016-05-15 NOTE — ED Notes (Signed)
At patients request, brought patient a maxi pad

## 2016-05-30 ENCOUNTER — Ambulatory Visit (INDEPENDENT_AMBULATORY_CARE_PROVIDER_SITE_OTHER): Payer: Managed Care, Other (non HMO) | Admitting: Obstetrics and Gynecology

## 2016-05-30 ENCOUNTER — Other Ambulatory Visit (HOSPITAL_COMMUNITY)
Admission: RE | Admit: 2016-05-30 | Discharge: 2016-05-30 | Disposition: A | Discharge status: Home / Self Care | Payer: Managed Care, Other (non HMO) | Source: Ambulatory Visit | Attending: Obstetrics and Gynecology | Admitting: Obstetrics and Gynecology

## 2016-05-30 DIAGNOSIS — E039 Hypothyroidism, unspecified: Secondary | ICD-10-CM | POA: Diagnosis not present

## 2016-05-30 DIAGNOSIS — R9389 Abnormal findings on diagnostic imaging of other specified body structures: Secondary | ICD-10-CM | POA: Insufficient documentation

## 2016-05-30 DIAGNOSIS — N950 Postmenopausal bleeding: Secondary | ICD-10-CM

## 2016-05-30 DIAGNOSIS — R938 Abnormal findings on diagnostic imaging of other specified body structures: Secondary | ICD-10-CM

## 2016-05-30 NOTE — Patient Instructions (Signed)
Endometrial Biopsy  Endometrial biopsy is a procedure in which a tissue sample is taken from inside the uterus. The sample is taken from the endometrium, which is the lining of the uterus. The tissue sample is then checked under a microscope to see if the tissue is normal or abnormal. This procedure helps to determine where you are in your menstrual cycle and how hormone levels are affecting the lining of the uterus. This procedure may also be used to evaluate uterine bleeding or to diagnose endometrial cancer, endometrial tuberculosis, polyps, or other inflammatory conditions.  Tell a health care provider about:   Any allergies you have.   All medicines you are taking, including vitamins, herbs, eye drops, creams, and over-the-counter medicines.   Any problems you or family members have had with anesthetic medicines.   Any blood disorders you have.   Any surgeries you have had.   Any medical conditions you have.   Whether you are pregnant or may be pregnant.  What are the risks?  Generally, this is a safe procedure. However, problems may occur, including:   Bleeding.   Pelvic infection.   Puncture of the wall of the uterus with the biopsy device (rare).    What happens before the procedure?   Keep a record of your menstrual cycles as told by your health care provider. You may need to schedule your procedure for a specific time in your cycle.   You may want to bring a sanitary pad to wear after the procedure.   Ask your health care provider about:  ? Changing or stopping your regular medicines. This is especially important if you are taking diabetes medicines or blood thinners.  ? Taking medicines such as aspirin and ibuprofen. These medicines can thin your blood. Do not take these medicines before your procedure if your health care provider instructs you not to.   Plan to have someone take you home from the hospital or clinic.  What happens during the procedure?   To lower your risk of  infection:  ? Your health care team will wash or sanitize their hands.   You will lie on an exam table with your feet and legs supported as in a pelvic exam.   Your health care provider will insert an instrument (speculum) into your vagina to see your cervix.   Your cervix will be cleansed with an antiseptic solution.   A medicine (local anesthetic) will be used to numb the cervix.   A forceps instrument (tenaculum) will be used to hold your cervix steady for the biopsy.   A thin, rod-like instrument (uterine sound) will be inserted through your cervix to determine the length of your uterus and the location where the biopsy sample will be removed.   A thin, flexible tube (catheter) will be inserted through your cervix and into the uterus. The catheter will be used to collect the biopsy sample from your endometrial tissue.   The catheter and speculum will then be removed, and the tissue sample will be sent to a lab for examination.  What happens after the procedure?   You will rest in a recovery area until you are ready to go home.   You may have mild cramping and a small amount of vaginal bleeding. This is normal.   It is up to you to get the results of your procedure. Ask your health care provider, or the department that is doing the procedure, when your results will be ready.  Summary     Endometrial biopsy is a procedure in which a tissue sample is taken from the endometrium, which is the lining of the uterus.   This procedure may help to diagnose menstrual cycle problems, abnormal bleeding, or other conditions affecting the endometrium.   Before the procedure, keep a record of your menstrual cycles as told by your health care provider.   The tissue sample that is removed will be checked under a microscope to see if it is normal or abnormal.  This information is not intended to replace advice given to you by your health care provider. Make sure you discuss any questions you have with your health care  provider.  Document Released: 05/27/2004 Document Revised: 02/10/2016 Document Reviewed: 02/10/2016  Elsevier Interactive Patient Education  2017 Elsevier Inc[employer]

## 2016-05-30 NOTE — Progress Notes (Signed)
Yvonne Pierce is a 61 yo whose LMP was over 10 years ago. She started having abd/pelvic cramps accompanied with VB on 05/15/16 She was seen the ER for this problem and noted to have a thicken endometrium of 17 mm.  She bleed for about a week and a half. Sister has had some PMB in the past as well.  C section x 3  H/O Hypothyroidism controlled on meds  Not sexual active  PE AF VSS Lungs clear Heart RRR Abd soft + BS obese midline scar GU nl EGBUS slightly atrophic cervix no lesions  Bimanual exam uterus small no masses limited by pt habitus   ENDOMETRIAL BIOPSY     The indications for endometrial biopsy were reviewed.   Risks of the biopsy including cramping, bleeding, infection, uterine perforation, inadequate specimen and need for additional procedures  were discussed. The patient states she understands and agrees to undergo procedure today. Consent was signed. Time out was performed. Urine HCG was negative. During the pelvic exam, the cervix was prepped with Betadine. A single-toothed tenaculum was placed on the anterior lip of the cervix to stabilize it. The 3 mm pipelle was introduced into the endometrial cavity without difficulty to a depth of 7cm, and a moderate amount of tissue was obtained and sent to pathology. The instruments were removed from the patient's vagina. Minimal bleeding from the cervix was noted. The patient tolerated the procedure well. Routine post-procedure instructions were given to the patient.    A/P PMB        Thicken endometrium  PMB reviewed with pt. Indications for [facility] reviewed with pt. EMBX completed as noted above. Pt will be contacted with BX and additional therapy as indicated

## 2016-06-08 ENCOUNTER — Telehealth: Payer: Self-pay

## 2016-06-08 NOTE — Telephone Encounter (Signed)
Patient has been informed of test results. She will watch her bleeding for now and will follow up as needed.

## 2016-06-08 NOTE — Telephone Encounter (Signed)
-----   Message from Chancy Milroy, MD sent at 06/08/2016  9:00 AM EDT ----- Please let pt know that her EMBX was negative. We can follow bleeding for now or she can make appt to discuss other management options Thanks Legrand Como

## 2016-11-23 ENCOUNTER — Other Ambulatory Visit (HOSPITAL_BASED_OUTPATIENT_CLINIC_OR_DEPARTMENT_OTHER): Payer: Self-pay | Admitting: Family Medicine

## 2016-11-23 DIAGNOSIS — M5430 Sciatica, unspecified side: Secondary | ICD-10-CM

## 2016-11-26 ENCOUNTER — Ambulatory Visit (HOSPITAL_BASED_OUTPATIENT_CLINIC_OR_DEPARTMENT_OTHER)
Admission: RE | Admit: 2016-11-26 | Discharge: 2016-11-26 | Disposition: A | Discharge status: Home / Self Care | Payer: Managed Care, Other (non HMO) | Source: Ambulatory Visit | Attending: Family Medicine | Admitting: Family Medicine

## 2016-11-26 DIAGNOSIS — M48061 Spinal stenosis, lumbar region without neurogenic claudication: Secondary | ICD-10-CM | POA: Insufficient documentation

## 2016-11-26 DIAGNOSIS — M5430 Sciatica, unspecified side: Secondary | ICD-10-CM

## 2016-11-26 DIAGNOSIS — M47816 Spondylosis without myelopathy or radiculopathy, lumbar region: Secondary | ICD-10-CM | POA: Insufficient documentation

## 2016-11-26 MED ORDER — GADOBENATE DIMEGLUMINE 529 MG/ML IV SOLN
15.0000 mL | Freq: Once | INTRAVENOUS | Status: AC | PRN
  Administered 2016-11-26: 15 mL via INTRAVENOUS

## 2017-01-16 ENCOUNTER — Ambulatory Visit: Payer: Managed Care, Other (non HMO) | Admitting: Cardiology

## 2017-03-24 ENCOUNTER — Encounter (INDEPENDENT_AMBULATORY_CARE_PROVIDER_SITE_OTHER): Payer: Self-pay

## 2017-03-24 ENCOUNTER — Ambulatory Visit (INDEPENDENT_AMBULATORY_CARE_PROVIDER_SITE_OTHER): Payer: Managed Care, Other (non HMO) | Admitting: Cardiology

## 2017-03-24 ENCOUNTER — Encounter: Payer: Self-pay | Admitting: Cardiology

## 2017-03-24 VITALS — BP 124/72 | HR 87 | Ht 63.0 in | Wt 226.6 lb

## 2017-03-24 DIAGNOSIS — E669 Obesity, unspecified: Secondary | ICD-10-CM

## 2017-03-24 DIAGNOSIS — G4733 Obstructive sleep apnea (adult) (pediatric): Secondary | ICD-10-CM

## 2017-03-24 NOTE — Patient Instructions (Signed)
Medication Instructions:  Your physician recommends that you continue on your current medications as directed. Please refer to the Current Medication list given to you today.  Labwork: None Ordered   Testing/Procedures: None Ordered   Follow-Up: Your physician recommends that you schedule a follow-up appointment in: 10 weeks with Dr. Radford Pax.    Any Other Special Instructions Will Be Listed Below (If Applicable).  CPAP orders have been placed. You will receive a call from the home health agency regarding setting up equipment. If you do not receive a call within the next week give Gae Bon, CPAP assistant a call at [phone].   Thank you for choosing [facility], RN  [phone]    If you need a refill on your cardiac medications before your next appointment, please call your pharmacy.

## 2017-03-24 NOTE — Progress Notes (Signed)
Cardiology Office Note:    Date:  03/24/2017   ID:  [name], DOB 06-30-1955, MRN [id number]  PCP:  Patient, No Pcp Per  Cardiologist:  No primary care provider on file.    Referring MD: Briscoe Deutscher, MD   Chief Complaint  Patient presents with  . Sleep Apnea    History of Present Illness:    Yvonne Pierce is a 62 y.o. female with a hx of moderate OSA with an AHI 14.4/hr now on CPAP at 9cm H2O.  She is doing well with her CPAP device.  She tolerates the full face mask but feels the pressure is not high enough.  She feels tired in the am but is awakened a lot at night by her husband getting up and down.  She has no significant daytime sleepiness.  She denies any significant mouth or nasal dryness or nasal congestion.  She does not think that he snores.  She would like to get a new machine.  Yvonne Pierce is 62 years old.    Past Medical History:  Diagnosis Date  . Cancer ([place])    thyroid  . Obesity (BMI 30-39.9)   . OSA (obstructive sleep apnea)    w AHI 14.4/hr now on CPAP at 9cm H2O  . Thyroid disease     Past Surgical History:  Procedure Laterality Date  . BACK SURGERY    . CESAREAN SECTION    . LAPAROSCOPIC APPENDECTOMY  03/15/2011   Procedure: APPENDECTOMY LAPAROSCOPIC;  Surgeon: Edward Jolly, MD;  Location: [facility];  Service: General;  Laterality: N/A;  Excision of infarcted torsion appendix epiploicae  . TOTAL THYROIDECTOMY      Current Medications: Current Meds  Medication Sig  . calcitRIOL (ROCALTROL) 0.25 MCG capsule   . hydrochlorothiazide (HYDRODIURIL) 25 MG tablet Take 1 tablet by mouth daily.  Marland Kitchen levothyroxine (SYNTHROID, LEVOTHROID) 150 MCG tablet   . UNABLE TO FIND CPAP  . Varenicline Tartrate (CHANTIX PO) Take by mouth.     Allergies:   Patient has no known allergies.   Social History   Socioeconomic History  . Marital status: Married    Spouse name: None  . Number of children: None  . Years of education: None  . Highest education level: None    Social Needs  . Financial resource strain: None  . Food insecurity - worry: None  . Food insecurity - inability: None  . Transportation needs - medical: None  . Transportation needs - non-medical: None  Occupational History  . None  Tobacco Use  . Smoking status: Current Some Day Smoker    Packs/day: 1.00    Types: Cigarettes  . Smokeless tobacco: Never Used  Substance and Sexual Activity  . Alcohol use: No  . Drug use: No  . Sexual activity: None  Other Topics Concern  . None  Social History Narrative  . None     Family History: The patient's family history includes Diabetes in her mother; Hypertension in her father.  ROS:   Please see the history of present illness.    ROS  All other systems reviewed and negative.   EKGs/Labs/Other Studies Reviewed:    The following studies were reviewed today: CPAP download  EKG:  EKG is not ordered today.  Recent Labs: 05/15/2016: BUN 15; Creatinine, Ser 0.91; Hemoglobin 14.4; Platelets 282; Potassium 3.5; Sodium 137   Recent Lipid Panel No results found for: CHOL, TRIG, HDL, CHOLHDL, VLDL, LDLCALC, LDLDIRECT  Physical Exam:    VS:  BP  124/72   Pulse 87   Ht 5\' 3"  (1.6 m)   Wt 226 lb 9.6 oz (102.8 kg)   SpO2 95%   BMI 40.14 kg/m     Wt Readings from Last 3 Encounters:  03/24/17 226 lb 9.6 oz (102.8 kg)  05/30/16 220 lb 11.2 oz (100.1 kg)  05/15/16 217 lb (98.4 kg)     GEN:  Well nourished, well developed in no acute distress HEENT: Normal NECK: No JVD; No carotid bruits LYMPHATICS: No lymphadenopathy CARDIAC: RRR, no murmurs, rubs, gallops RESPIRATORY:  Clear to auscultation without rales, wheezing or rhonchi  ABDOMEN: Soft, non-tender, non-distended MUSCULOSKELETAL:  No edema; No deformity  SKIN: Warm and dry NEUROLOGIC:  Alert and oriented x 3 PSYCHIATRIC:  Normal affect   ASSESSMENT:    1. OSA (obstructive sleep apnea)   2. Obesity (BMI 30-39.9)    PLAN:    In order of problems listed above:  1.   OSA - the patient is tolerating PAP therapy well but feels the pressure is not high enough. The patient has been using and benefiting from PAP use and will continue to benefit from therapy. I will get a download from her DME.  She would like a new device as her is 62 years old.  I will order a new ResMed CPAP with heated humidity and set her ou auto at 4 to 18cm H2O and then get a download in 2 weeks. She would like to try a nasal pillow mask which I will order with a chin strap.    2.  Obesity - I have encouraged her to get into a routine exercise program and cut back on carbs and portions.    Medication Adjustments/Labs and Tests Ordered: Current medicines are reviewed at length with the patient today.  Concerns regarding medicines are outlined above.  No orders of the defined types were placed in this encounter.  No orders of the defined types were placed in this encounter.   Signed, Fransico Him, MD  03/24/2017 9:49 AM    [place]

## 2017-04-04 ENCOUNTER — Telehealth: Payer: Self-pay | Admitting: Cardiology

## 2017-04-04 NOTE — Telephone Encounter (Signed)
New Message   Patient is inquiring about the cpap machine that was to be ordered. At this time she has not heard what vendor was contacted. Please call to discuss.

## 2017-04-04 NOTE — Telephone Encounter (Signed)
Spoke with patient about her cpap machine question..  Told her we would get back in touch with her and thanked her for her patience.Marland Kitchen

## 2017-04-05 NOTE — Telephone Encounter (Signed)
LMTCB

## 2017-04-07 NOTE — Telephone Encounter (Signed)
Return call: Patient called back to inform PAP assistant that Dr Radford Pax had ordered her a new CPAP machine on  February 15 office visit. PAP assistant found the order and faxed all paperwork to her DME Huey Romans).  Patient has been notified and she was grateful for the call.

## 2017-04-07 NOTE — Telephone Encounter (Signed)
LMTCB

## 2017-04-11 NOTE — Telephone Encounter (Signed)
Reached out to [place] spoke to [place] who confirmed all information needed has been received to get the patient her new CPAP.

## 2017-06-05 ENCOUNTER — Ambulatory Visit (INDEPENDENT_AMBULATORY_CARE_PROVIDER_SITE_OTHER): Payer: Managed Care, Other (non HMO) | Admitting: Cardiology

## 2017-06-05 VITALS — BP 114/72 | HR 67 | Ht 63.0 in | Wt 220.0 lb

## 2017-06-05 DIAGNOSIS — G4733 Obstructive sleep apnea (adult) (pediatric): Secondary | ICD-10-CM

## 2017-06-05 DIAGNOSIS — E669 Obesity, unspecified: Secondary | ICD-10-CM | POA: Diagnosis not present

## 2017-06-05 NOTE — Patient Instructions (Signed)
Medication Instructions:  Your physician recommends that you continue on your current medications as directed. Please refer to the Current Medication list given to you today.  If you need a refill on your cardiac medications, please contact your pharmacy first.  Labwork: None ordered   Testing/Procedures: None ordered   Follow-Up: Your physician wants you to follow-up in: 1 year with Dr. Turner. You will receive a reminder letter in the mail two months in advance. If you don't receive a letter, please call our office to schedule the follow-up appointment.  Any Other Special Instructions Will Be Listed Below (If Applicable).   Thank you for choosing [facility]    Rena Shandale Malak, RN  [phone]  If you need a refill on your cardiac medications before your next appointment, please call your pharmacy.   

## 2017-06-05 NOTE — Progress Notes (Addendum)
Cardiology Office Note:    Date:  06/05/2017   ID:  Yvonne Pierce, DOB 1955-06-01, MRN [id number]  PCP:  Patient, No Pcp Per  Cardiologist:  No primary care provider on file.    Referring MD: No ref. provider found   Chief Complaint  Patient presents with  . Sleep Apnea    History of Present Illness:    Yvonne Pierce is a 62 y.o. female with a hx of moderate OSA with an AHI 14.4/hr now on CPAP at 9cm H2O. I recently saw her a few months ago and she requested getting a device which we ordered and she is on.  She is now back for evaluation of her insurance requirements.She is doing well with her CPAP device and thinks that she has gotten used to it.  She tolerates the mask and feels the pressure is adequate.  Since going on CPAP She feels rested in the am and has no significant daytime sleepiness.  She denies any significant mouth or nasal dryness or nasal congestion.  She does not think that he snores.       Past Medical History:  Diagnosis Date  . Cancer ([place])    thyroid  . Obesity (BMI 30-39.9)   . OSA (obstructive sleep apnea)    w AHI 14.4/hr now on CPAP at 9cm H2O  . Thyroid disease     Past Surgical History:  Procedure Laterality Date  . BACK SURGERY    . CESAREAN SECTION    . LAPAROSCOPIC APPENDECTOMY  03/15/2011   Procedure: APPENDECTOMY LAPAROSCOPIC;  Surgeon: Edward Jolly, MD;  Location: [facility];  Service: General;  Laterality: N/A;  Excision of infarcted torsion appendix epiploicae  . TOTAL THYROIDECTOMY      Current Medications: Current Meds  Medication Sig  . aspirin 81 MG EC tablet Take 82 mg by mouth daily.  . calcitRIOL (ROCALTROL) 0.25 MCG capsule   . hydrochlorothiazide (HYDRODIURIL) 25 MG tablet Take 1 tablet by mouth daily.  Marland Kitchen levothyroxine (SYNTHROID, LEVOTHROID) 150 MCG tablet   . metFORMIN (GLUCOPHAGE) 500 MG tablet Take 500 mg by mouth 2 (two) times daily with a meal.  . UNABLE TO FIND CPAP  . Varenicline Tartrate (CHANTIX PO) Take by mouth.     Allergies:   Patient has no known allergies.   Social History   Socioeconomic History  . Marital status: Married    Spouse name: Not on file  . Number of children: Not on file  . Years of education: Not on file  . Highest education level: Not on file  Occupational History  . Not on file  Social Needs  . Financial resource strain: Not on file  . Food insecurity:    Worry: Not on file    Inability: Not on file  . Transportation needs:    Medical: Not on file    Non-medical: Not on file  Tobacco Use  . Smoking status: Current Some Day Smoker    Packs/day: 1.00    Types: Cigarettes  . Smokeless tobacco: Never Used  Substance and Sexual Activity  . Alcohol use: No  . Drug use: No  . Sexual activity: Not on file  Lifestyle  . Physical activity:    Days per week: Not on file    Minutes per session: Not on file  . Stress: Not on file  Relationships  . Social connections:    Talks on phone: Not on file    Gets together: Not on file  Attends religious service: Not on file    Active member of club or organization: Not on file    Attends meetings of clubs or organizations: Not on file    Relationship status: Not on file  Other Topics Concern  . Not on file  Social History Narrative  . Not on file     Family History: The patient's family history includes Diabetes in her mother; Hypertension in her father.  ROS:   Please see the history of present illness.    ROS  All other systems reviewed and negative.   EKGs/Labs/Other Studies Reviewed:    The following studies were reviewed today: PAP download  EKG:  EKG is not ordered today.    Recent Labs: No results found for requested labs within last 8760 hours.   Recent Lipid Panel No results found for: CHOL, TRIG, HDL, CHOLHDL, VLDL, LDLCALC, LDLDIRECT  Physical Exam:    VS:  BP 114/72   Pulse 67   Ht 5\' 3"  (1.6 m)   Wt 220 lb (99.8 kg)   BMI 38.97 kg/m     Wt Readings from Last 3 Encounters:  06/05/17  220 lb (99.8 kg)  03/24/17 226 lb 9.6 oz (102.8 kg)  05/30/16 220 lb 11.2 oz (100.1 kg)     GEN:  Well nourished, well developed in no acute distress HEENT: Normal NECK: No JVD; No carotid bruits LYMPHATICS: No lymphadenopathy CARDIAC: RRR, no murmurs, rubs, gallops RESPIRATORY:  Clear to auscultation without rales, wheezing or rhonchi  ABDOMEN: Soft, non-tender, non-distended MUSCULOSKELETAL:  No edema; No deformity  SKIN: Warm and dry NEUROLOGIC:  Alert and oriented x 3 PSYCHIATRIC:  Normal affect   ASSESSMENT:    1. OSA (obstructive sleep apnea)   2. Obesity (BMI 30-39.9)    PLAN:    In order of problems listed above:  1.  OSA - the patient is tolerating PAP therapy well without any problems. The PAP download was reviewed today and showed an AHI of 1.8/hr on auto PAP with 93% compliance in using more than 4 hours nightly.  The patient has been using and benefiting from PAP use and will continue to benefit from therapy.   2.  Obesity - I have encouraged her to get into a routine exercise program and cut back on carbs and portions.      Medication Adjustments/Labs and Tests Ordered: Current medicines are reviewed at length with the patient today.  Concerns regarding medicines are outlined above.  No orders of the defined types were placed in this encounter.  No orders of the defined types were placed in this encounter.   Signed, Fransico Him, MD  06/05/2017 8:52 AM    [facility]

## 2017-07-18 DIAGNOSIS — D492 Neoplasm of unspecified behavior of bone, soft tissue, and skin: Secondary | ICD-10-CM | POA: Diagnosis not present

## 2017-07-18 DIAGNOSIS — L821 Other seborrheic keratosis: Secondary | ICD-10-CM | POA: Diagnosis not present

## 2018-03-28 DIAGNOSIS — E890 Postprocedural hypothyroidism: Secondary | ICD-10-CM | POA: Diagnosis not present

## 2018-03-28 DIAGNOSIS — E782 Mixed hyperlipidemia: Secondary | ICD-10-CM | POA: Diagnosis not present

## 2018-03-28 DIAGNOSIS — I10 Essential (primary) hypertension: Secondary | ICD-10-CM | POA: Diagnosis not present

## 2018-03-28 DIAGNOSIS — E119 Type 2 diabetes mellitus without complications: Secondary | ICD-10-CM | POA: Diagnosis not present

## 2018-06-19 ENCOUNTER — Telehealth: Payer: Self-pay | Admitting: Cardiology

## 2018-06-19 NOTE — Progress Notes (Signed)
Virtual Visit via Video Note   This visit type was conducted due to national recommendations for restrictions regarding the COVID-19 Pandemic (e.g. social distancing) in an effort to limit this patient's exposure and mitigate transmission in our community.  Due to her co-morbid illnesses, this patient is at least at moderate risk for complications without adequate follow up.  This format is felt to be most appropriate for this patient at this time.  All issues noted in this document were discussed and addressed.  A limited physical exam was performed with this format.  Please refer to the patient's chart for her consent to telehealth for [facility].   Evaluation Performed:  Follow-up visit  This visit type was conducted due to national recommendations for restrictions regarding the COVID-19 Pandemic (e.g. social distancing).  This format is felt to be most appropriate for this patient at this time.  All issues noted in this document were discussed and addressed.  No physical exam was performed (except for noted visual exam findings with Video Visits).  Please refer to the patient's chart (MyChart message for video visits and phone note for telephone visits) for the patient's consent to telehealth for [facility].  Date:  06/20/2018   ID:  Yvonne Pierce, DOB 08-08-1955, MRN [id number]  Patient Location:  Home  Provider location:   [facility]  PCP:  Patient, No Pcp Per  Sleep Medicine:  Fransico Him, MD Electrophysiologist:  None   Chief Complaint:  OSA  History of Present Illness:    Yvonne Pierce is a 63 y.o. female who presents via audio/video conferencing for a telehealth visit today.    Yvonne Pierce is a 63y.o. female with a hx of moderate OSA with anAHI 14.4/hr now on CPAP at 9cm H2O.  She is doing well with her CPAP device and thinks that she has gotten used to it.  She tolerates the mask and feels the pressure is adequate.  Since going on CPAP she feels rested in the am and has  no significant daytime sleepiness.  She denies any significant mouth or nasal dryness or nasal congestion.  She does not think that he snores.    The patient does not have symptoms concerning for COVID-19 infection (fever, chills, cough, or new shortness of breath).   Prior CV studies:   The following studies were reviewed today:  PAP compliance download  Past Medical History:  Diagnosis Date  . Cancer ([place])    thyroid  . Obesity (BMI 30-39.9)   . OSA (obstructive sleep apnea)    w AHI 14.4/hr now on CPAP at 9cm H2O  . Thyroid disease    Past Surgical History:  Procedure Laterality Date  . BACK SURGERY    . CESAREAN SECTION    . LAPAROSCOPIC APPENDECTOMY  03/15/2011   Procedure: APPENDECTOMY LAPAROSCOPIC;  Surgeon: Edward Jolly, MD;  Location: [facility];  Service: General;  Laterality: N/A;  Excision of infarcted torsion appendix epiploicae  . TOTAL THYROIDECTOMY       Current Meds  Medication Sig  . aspirin 81 MG EC tablet Take 82 mg by mouth daily.  . calcitRIOL (ROCALTROL) 0.25 MCG capsule   . hydrochlorothiazide (HYDRODIURIL) 25 MG tablet Take 1 tablet by mouth daily.  Marland Kitchen levothyroxine (SYNTHROID, LEVOTHROID) 150 MCG tablet   . loratadine (CLARITIN) 10 MG tablet Take 1 tablet by mouth as needed.  . metFORMIN (GLUCOPHAGE) 500 MG tablet Take 500 mg by mouth 2 (two) times daily with a meal.  . pravastatin (  PRAVACHOL) 40 MG tablet Take 1 tablet by mouth daily.  Marland Kitchen UNABLE TO FIND CPAP     Allergies:   Patient has no known allergies.   Social History   Tobacco Use  . Smoking status: Current Some Day Smoker    Packs/day: 1.00    Types: Cigarettes  . Smokeless tobacco: Never Used  Substance Use Topics  . Alcohol use: No  . Drug use: No     Family Hx: The patient's family history includes Diabetes in her mother; Hypertension in her father.  ROS:   Please see the history of present illness.     All other systems reviewed and are negative.   Labs/Other Tests and  Data Reviewed:    Recent Labs: No results found for requested labs within last 8760 hours.   Recent Lipid Panel No results found for: CHOL, TRIG, HDL, CHOLHDL, LDLCALC, LDLDIRECT  Wt Readings from Last 3 Encounters:  06/05/17 220 lb (99.8 kg)  03/24/17 226 lb 9.6 oz (102.8 kg)  05/30/16 220 lb 11.2 oz (100.1 kg)     Objective:    Vital Signs:  Ht 5\' 3"  (1.6 m)   BMI 38.97 kg/m    CONSTITUTIONAL:  Well nourished, well developed female in no acute distress.  EYES: anicteric MOUTH: oral mucosa is pink RESPIRATORY: Normal respiratory effort, symmetric expansion CARDIOVASCULAR: No peripheral edema SKIN: No rash, lesions or ulcers MUSCULOSKELETAL: no digital cyanosis NEURO: Cranial Nerves II-XII grossly intact, moves all extremities PSYCH: Intact judgement and insight.  A&O x 3, Mood/affect appropriate   ASSESSMENT & PLAN:    1.  OSA - the patient is tolerating PAP therapy well without any problems. The PAP download was reviewed today and showed an AHI of 0.6/hr on auto PAP  with 100% compliance in using more than 4 hours nightly.  The patient has been using and benefiting from PAP use and will continue to benefit from therapy.   2.  Obesity - I have encouraged her to get into a routine exercise program and cut back on carbs and portions.   3.  COVID-19 Education:The signs and symptoms of COVID-19 were discussed with the patient and how to seek care for testing (follow up with PCP or arrange E-visit).  The importance of social distancing was discussed today.  Patient Risk:   After full review of this patient's clinical status, I feel that they are at least moderate risk at this time.  Time:   Today, I have spent 15 minutes with patient in telehealth medicine.  Medication Adjustments/Labs and Tests Ordered: Current medicines are reviewed at length with the patient today.  Concerns regarding medicines are outlined above.  Tests Ordered: No orders of the defined types were  placed in this encounter.  Medication Changes: No orders of the defined types were placed in this encounter.   Disposition:  Follow up in 1 year(s)  Signed, Fransico Him, MD  06/20/2018 8:46 AM    [facility]

## 2018-06-19 NOTE — Telephone Encounter (Signed)
Virtual Visit Pre-Appointment Phone Call  "(Name), I am calling you today to discuss your upcoming appointment. We are currently trying to limit exposure to the virus that causes COVID-19 by seeing patients at home rather than in the office."  1. "What is the BEST phone number to call the day of the visit?" - include this in appointment notes  2. Do you have or have access to (through a family member/friend) a smartphone with video capability that we can use for your visit?" a. If yes - list this number in appt notes as cell (if different from BEST phone #) and list the appointment type as a VIDEO visit in appointment notes b. If no - list the appointment type as a PHONE visit in appointment notes  3. Confirm consent - "In the setting of the current Covid19 crisis, you are scheduled for a (phone or video) visit with your provider on (date) at (time).  Just as we do with many in-office visits, in order for you to participate in this visit, we must obtain consent.  If you'd like, I can send this to your mychart (if signed up) or email for you to review.  Otherwise, I can obtain your verbal consent now.  All virtual visits are billed to your insurance company just like a normal visit would be.  By agreeing to a virtual visit, we'd like you to understand that the technology does not allow for your provider to perform an examination, and thus may limit your provider's ability to fully assess your condition. If your provider identifies any concerns that need to be evaluated in person, we will make arrangements to do so.  Finally, though the technology is pretty good, we cannot assure that it will always work on either your or our end, and in the setting of a video visit, we may have to convert it to a phone-only visit.  In either situation, we cannot ensure that we have a secure connection.  Are you willing to proceed?"  YES  4. Advise patient to be prepared - "Two hours prior to your appointment, go  ahead and check your blood pressure, pulse, oxygen saturation, and your weight (if you have the equipment to check those) and write them all down. When your visit starts, your provider will ask you for this information. If you have an Apple Watch or Kardia device, please plan to have heart rate information ready on the day of your appointment. Please have a pen and paper handy nearby the day of the visit as well."  5. Give patient instructions for MyChart download to smartphone OR [email or web address] as below if video visit (depending on what platform provider is using)  6. Inform patient they will receive a phone call 15 minutes prior to their appointment time (may be from unknown caller ID) so they should be prepared to answer    TELEPHONE CALL NOTE  Yvonne Pierce has been deemed a candidate for a follow-up tele-health visit to limit community exposure during the Covid-19 pandemic. I spoke with the patient via phone to ensure availability of phone/video source, confirm preferred email & phone number, and discuss instructions and expectations.  I reminded Yvonne Pierce to be prepared with any vital sign and/or heart rhythm information that could potentially be obtained via home monitoring, at the time of her visit. I reminded Yvonne Pierce to expect a phone call prior to her visit.  Yvonne Pierce 06/19/2018 11:17 AM   INSTRUCTIONS FOR DOWNLOADING THE Yvonne Pierce  APP TO SMARTPHONE  - The patient must first make sure to have activated MyChart and know their login information - If Apple, go to [employer] and type in MyChart in the search bar and download the app. If Android, ask patient to go to [employer] and type in [place] in the search bar and download the app. The app is free but as with any other app downloads, their phone may require them to verify saved payment information or Apple/Android password.  - The patient will need to then log into the app with their MyChart username and password, and  select [facility] as their healthcare provider to link the account. When it is time for your visit, go to the MyChart app, find appointments, and click Begin Video Visit. Be sure to Select Allow for your device to access the Microphone and Camera for your visit. You will then be connected, and your provider will be with you shortly.  **If they have any issues connecting, or need assistance please contact MyChart service desk [phone])**  **If using a computer, in order to ensure the best quality for their visit they will need to use either of the following Internet Browsers: [employer], or Google Chrome**  IF USING DOXIMITY or DOXY.ME - The patient will receive a link just prior to their visit by text.     FULL LENGTH CONSENT FOR TELE-HEALTH VISIT   I hereby voluntarily request, consent and authorize [facility] and its employed or contracted physicians, physician assistants, nurse practitioners or other licensed health care professionals (the Practitioner), to provide me with telemedicine health care services (the Services") as deemed necessary by the treating Practitioner. I acknowledge and consent to receive the Services by the Practitioner via telemedicine. I understand that the telemedicine visit will involve communicating with the Practitioner through live audiovisual communication technology and the disclosure of certain medical information by electronic transmission. I acknowledge that I have been given the opportunity to request an in-person assessment or other available alternative prior to the telemedicine visit and am voluntarily participating in the telemedicine visit.  I understand that I have the right to withhold or withdraw my consent to the use of telemedicine in the course of my care at any time, without affecting my right to future care or treatment, and that the Practitioner or I may terminate the telemedicine visit at any time. I understand that I have  the right to inspect all information obtained and/or recorded in the course of the telemedicine visit and may receive copies of available information for a reasonable fee.  I understand that some of the potential risks of receiving the Services via telemedicine include:   Delay or interruption in medical evaluation due to technological equipment failure or disruption;  Information transmitted may not be sufficient (e.g. poor resolution of images) to allow for appropriate medical decision making by the Practitioner; and/or   In rare instances, security protocols could fail, causing a breach of personal health information.  Furthermore, I acknowledge that it is my responsibility to provide information about my medical history, conditions and care that is complete and accurate to the best of my ability. I acknowledge that Practitioner's advice, recommendations, and/or decision may be based on factors not within their control, such as incomplete or inaccurate data provided by me or distortions of diagnostic images or specimens that may result from electronic transmissions. I understand that the practice of medicine is not an exact science and that Practitioner makes no  warranties or guarantees regarding treatment outcomes. I acknowledge that I will receive a copy of this consent concurrently upon execution via email to the email address I last provided but may also request a printed copy by calling the office of [facility].    I understand that my insurance will be billed for this visit.   I have read or had this consent read to me.  I understand the contents of this consent, which adequately explains the benefits and risks of the Services being provided via telemedicine.   I have been provided ample opportunity to ask questions regarding this consent and the Services and have had my questions answered to my satisfaction.  I give my informed consent for the services to be provided through the use  of telemedicine in my medical care  By participating in this telemedicine visit I agree to the above.

## 2018-06-20 ENCOUNTER — Other Ambulatory Visit: Payer: Self-pay

## 2018-06-20 ENCOUNTER — Encounter: Payer: Self-pay | Admitting: Cardiology

## 2018-06-20 ENCOUNTER — Telehealth (INDEPENDENT_AMBULATORY_CARE_PROVIDER_SITE_OTHER): Payer: BLUE CROSS/BLUE SHIELD | Admitting: Cardiology

## 2018-06-20 VITALS — Ht 63.0 in

## 2018-06-20 DIAGNOSIS — G4733 Obstructive sleep apnea (adult) (pediatric): Secondary | ICD-10-CM | POA: Diagnosis not present

## 2018-06-20 DIAGNOSIS — E669 Obesity, unspecified: Secondary | ICD-10-CM

## 2018-06-20 NOTE — Patient Instructions (Signed)
Medication Instructions:  Your provider recommends that you continue on your current medications as directed. Please refer to the Current Medication list given to you today.    Labwork: None  Testing/Procedures: None  Follow-Up: Your provider wants you to follow-up in: 1 year with Dr. Turner. You will receive a reminder letter in the mail two months in advance. If you don't receive a letter, please call our office to schedule the follow-up appointment.   

## 2018-10-01 DIAGNOSIS — E119 Type 2 diabetes mellitus without complications: Secondary | ICD-10-CM | POA: Diagnosis not present

## 2018-10-01 DIAGNOSIS — E782 Mixed hyperlipidemia: Secondary | ICD-10-CM | POA: Diagnosis not present

## 2018-10-01 DIAGNOSIS — E890 Postprocedural hypothyroidism: Secondary | ICD-10-CM | POA: Diagnosis not present

## 2018-10-01 DIAGNOSIS — I10 Essential (primary) hypertension: Secondary | ICD-10-CM | POA: Diagnosis not present

## 2018-10-23 DIAGNOSIS — Z1211 Encounter for screening for malignant neoplasm of colon: Secondary | ICD-10-CM | POA: Diagnosis not present

## 2018-10-23 DIAGNOSIS — Z01818 Encounter for other preprocedural examination: Secondary | ICD-10-CM | POA: Diagnosis not present

## 2018-10-23 DIAGNOSIS — Z800 Family history of malignant neoplasm of digestive organs: Secondary | ICD-10-CM | POA: Diagnosis not present

## 2018-11-08 DIAGNOSIS — Z1231 Encounter for screening mammogram for malignant neoplasm of breast: Secondary | ICD-10-CM | POA: Diagnosis not present

## 2018-11-23 DIAGNOSIS — B078 Other viral warts: Secondary | ICD-10-CM | POA: Diagnosis not present

## 2018-11-23 DIAGNOSIS — L821 Other seborrheic keratosis: Secondary | ICD-10-CM | POA: Diagnosis not present

## 2018-12-17 DIAGNOSIS — Z7984 Long term (current) use of oral hypoglycemic drugs: Secondary | ICD-10-CM | POA: Diagnosis not present

## 2018-12-17 DIAGNOSIS — E119 Type 2 diabetes mellitus without complications: Secondary | ICD-10-CM | POA: Diagnosis not present

## 2019-03-21 DIAGNOSIS — Z0000 Encounter for general adult medical examination without abnormal findings: Secondary | ICD-10-CM | POA: Diagnosis not present

## 2019-03-21 DIAGNOSIS — I10 Essential (primary) hypertension: Secondary | ICD-10-CM | POA: Diagnosis not present

## 2019-03-21 DIAGNOSIS — E782 Mixed hyperlipidemia: Secondary | ICD-10-CM | POA: Diagnosis not present

## 2019-03-21 DIAGNOSIS — E890 Postprocedural hypothyroidism: Secondary | ICD-10-CM | POA: Diagnosis not present

## 2019-03-21 DIAGNOSIS — E1169 Type 2 diabetes mellitus with other specified complication: Secondary | ICD-10-CM | POA: Diagnosis not present

## 2023-10-24 DIAGNOSIS — E119 Type 2 diabetes mellitus without complications: Secondary | ICD-10-CM | POA: Diagnosis not present

## 2023-10-24 DIAGNOSIS — F17210 Nicotine dependence, cigarettes, uncomplicated: Secondary | ICD-10-CM | POA: Diagnosis not present

## 2023-10-24 DIAGNOSIS — Z79899 Other long term (current) drug therapy: Secondary | ICD-10-CM | POA: Diagnosis not present

## 2023-10-24 DIAGNOSIS — Z7984 Long term (current) use of oral hypoglycemic drugs: Secondary | ICD-10-CM | POA: Diagnosis not present

## 2023-10-24 DIAGNOSIS — J441 Chronic obstructive pulmonary disease with (acute) exacerbation: Secondary | ICD-10-CM | POA: Diagnosis not present

## 2023-10-24 DIAGNOSIS — R0789 Other chest pain: Secondary | ICD-10-CM | POA: Diagnosis not present

## 2023-10-24 DIAGNOSIS — R9431 Abnormal electrocardiogram [ECG] [EKG]: Secondary | ICD-10-CM | POA: Diagnosis not present

## 2023-10-24 DIAGNOSIS — F17200 Nicotine dependence, unspecified, uncomplicated: Secondary | ICD-10-CM | POA: Diagnosis not present

## 2023-10-24 DIAGNOSIS — I491 Atrial premature depolarization: Secondary | ICD-10-CM | POA: Diagnosis not present

## 2023-10-24 DIAGNOSIS — N3000 Acute cystitis without hematuria: Secondary | ICD-10-CM | POA: Diagnosis not present

## 2023-11-01 DIAGNOSIS — R062 Wheezing: Secondary | ICD-10-CM | POA: Diagnosis not present

## 2023-11-01 DIAGNOSIS — F17210 Nicotine dependence, cigarettes, uncomplicated: Secondary | ICD-10-CM | POA: Diagnosis not present

## 2023-11-01 DIAGNOSIS — R0683 Snoring: Secondary | ICD-10-CM | POA: Diagnosis not present
